# Patient Record
Sex: Male | Born: 1955 | Race: Black or African American | Hispanic: No | Marital: Married | Smoking: Never smoker
Health system: Southern US, Community
[De-identification: ages and names within clinical notes are randomized; demographics above are authoritative.]

## PROBLEM LIST (undated history)

## (undated) DIAGNOSIS — IMO0002 Reserved for concepts with insufficient information to code with codable children: Secondary | ICD-10-CM

## (undated) DIAGNOSIS — N9989 Other postprocedural complications and disorders of genitourinary system: Secondary | ICD-10-CM

## (undated) DIAGNOSIS — I10 Essential (primary) hypertension: Secondary | ICD-10-CM

## (undated) DIAGNOSIS — Z9889 Other specified postprocedural states: Secondary | ICD-10-CM

## (undated) DIAGNOSIS — E785 Hyperlipidemia, unspecified: Secondary | ICD-10-CM

## (undated) DIAGNOSIS — R112 Nausea with vomiting, unspecified: Secondary | ICD-10-CM

## (undated) HISTORY — DX: Essential (primary) hypertension: I10

## (undated) SURGERY — ARTHROSCOPY, KNEE, WITH LATERAL MENISCECTOMY
Anesthesia: Choice | Site: Knee | Laterality: Right

---

## 2006-03-10 HISTORY — PX: OTHER SURGICAL HISTORY: SHX169

## 2009-03-10 HISTORY — PX: OTHER SURGICAL HISTORY: SHX169

## 2011-05-06 ENCOUNTER — Ambulatory Visit (INDEPENDENT_AMBULATORY_CARE_PROVIDER_SITE_OTHER): Payer: BC Managed Care – PPO | Admitting: Orthopedic Surgery

## 2011-05-06 ENCOUNTER — Ambulatory Visit: Payer: Self-pay

## 2011-05-06 ENCOUNTER — Encounter: Payer: Self-pay | Admitting: Orthopedic Surgery

## 2011-05-06 VITALS — BP 130/60 | Ht 73.0 in | Wt 242.0 lb

## 2011-05-06 DIAGNOSIS — S43429A Sprain of unspecified rotator cuff capsule, initial encounter: Secondary | ICD-10-CM

## 2011-05-06 DIAGNOSIS — M25519 Pain in unspecified shoulder: Secondary | ICD-10-CM | POA: Insufficient documentation

## 2011-05-06 DIAGNOSIS — M75101 Unspecified rotator cuff tear or rupture of right shoulder, not specified as traumatic: Secondary | ICD-10-CM | POA: Insufficient documentation

## 2011-05-06 NOTE — Patient Instructions (Signed)
You have been scheduled for an MRI scan.  Your insurance company requires advocate precertification prior to scheduling the MRI.  If her MRI scan is not improved we will let you know and make further treatment recommendations according to your insurance's guidelines.   We will schedule you for a number of appointment to review the results and make further treatment recommendations

## 2011-05-06 NOTE — Progress Notes (Signed)
Patient ID: Scott Savage, male   DOB: 1955-08-05, 56 y.o.   MRN: [id number]  X-ray report  May 06, 2011  RIGHT shoulder pain gradual onset  The glenohumeral joint shows no joint space narrowing there is a small hint of an osteophyte at the inferior margin of the RIGHT humerus.  The shoulder Shenton's line has been broken and there is proximal migration of the humerus mild with a decreased acromial humeral distance.  He the acromion is a type II  Impression x-ray findings suggestive of rotator cuff disease if not tear. Subjective:    Scott Savage is a 56 y.o. male who presents with right shoulder pain for 2 months. The patient reports primarily constant pain which is worse at night. He cannot sleep on his right shoulder secondary to pain. 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 He has pain with forward elevation overhead activity with weakness. He had a left rotator cuff tear and repair several years ago and did well.  The pain is moderate to severe and described as dull aching over the right shoulder in the anterolateral area. Certain positions cause him discomfort and he cannot internally rotate his arm behind his back.  His pain is described as 7/10 constant worse at night throbbing associated with catching.   The following portions of the patient's history were reviewed and updated as appropriate: allergies, current medications, past family history, past medical history, past social history, past surgical history and problem list.  Review of Systems A comprehensive review of systems was negative except for: joint pain    Objective:    BP 130/60  Ht 6\' 1"  (1.854 m)  Wt 109.77 kg (242 lb)  BMI 31.93 kg/m2 Right shoulder: tenderness over the glenohumeral joint, distal neuromuscular exam negative, positive for tenderness about the glenohumeral joint, negative for tenderness over the acromioclavicular joint, positive for impingement sign,  sensory exam normal, motor exam normal, radial pulse intact and There is weakness in forward elevation as well as external rotation. Belly press test was normal. Shoulder was stable.  Left shoulder: normal active ROM, no tenderness, no impingement sign     Assessment:    Right Rotator cuff tear    Plan:    MRI.

## 2011-05-12 ENCOUNTER — Telehealth: Payer: Self-pay | Admitting: Orthopedic Surgery

## 2011-05-12 NOTE — Telephone Encounter (Signed)
Patient called in response to message left today - he received his MRI appointment, scheduled for 05/15/11 at [facility], 3:45pm registration.  He is to follow up here for results, appointment has been scheduled for 05/22/11.

## 2011-05-15 ENCOUNTER — Ambulatory Visit (HOSPITAL_COMMUNITY)
Admission: RE | Admit: 2011-05-15 | Discharge: 2011-05-15 | Disposition: A | Discharge status: Home / Self Care | Payer: BC Managed Care – PPO | Source: Ambulatory Visit | Attending: Orthopedic Surgery | Admitting: Orthopedic Surgery

## 2011-05-15 DIAGNOSIS — M75101 Unspecified rotator cuff tear or rupture of right shoulder, not specified as traumatic: Secondary | ICD-10-CM

## 2011-05-15 DIAGNOSIS — S46819A Strain of other muscles, fascia and tendons at shoulder and upper arm level, unspecified arm, initial encounter: Secondary | ICD-10-CM | POA: Insufficient documentation

## 2011-05-15 DIAGNOSIS — M25519 Pain in unspecified shoulder: Secondary | ICD-10-CM | POA: Insufficient documentation

## 2011-05-15 DIAGNOSIS — M25619 Stiffness of unspecified shoulder, not elsewhere classified: Secondary | ICD-10-CM | POA: Insufficient documentation

## 2011-05-15 DIAGNOSIS — X58XXXA Exposure to other specified factors, initial encounter: Secondary | ICD-10-CM | POA: Insufficient documentation

## 2011-05-22 ENCOUNTER — Encounter: Payer: Self-pay | Admitting: Orthopedic Surgery

## 2011-05-22 ENCOUNTER — Ambulatory Visit (INDEPENDENT_AMBULATORY_CARE_PROVIDER_SITE_OTHER): Payer: BC Managed Care – PPO | Admitting: Orthopedic Surgery

## 2011-05-22 VITALS — BP 120/70 | Ht 73.0 in | Wt 242.0 lb

## 2011-05-22 DIAGNOSIS — M75101 Unspecified rotator cuff tear or rupture of right shoulder, not specified as traumatic: Secondary | ICD-10-CM

## 2011-05-22 DIAGNOSIS — S43429A Sprain of unspecified rotator cuff capsule, initial encounter: Secondary | ICD-10-CM

## 2011-05-22 NOTE — Patient Instructions (Signed)
Rotator cuff tear   Need OOW 6 weeks   Then light duty

## 2011-05-22 NOTE — Progress Notes (Signed)
Patient ID: Scott Savage, male   DOB: 11/16/1955, 56 y.o.   MRN: [id number] Chief Complaint  Patient presents with  . Results    review MRI results, Rt rotator cuff   MRI RIGHT shoulder IMPRESSION:  1. Large full-thickness retracted tears of the infraspinatus is  supraspinatus tendons with atrophy of those muscles.  2. At least partial tear of the long head of the biceps tendon.  3. Focal degenerative tear of the posterior-superior aspect of the  labrum.  4. Prominent spurs at the [facility] joint which could predispose to  Impingement.  We discussed the surgical procedure required, which would need light duty for 6 weeks and in 6 months of rehabilitation before full duty would be allowed.  We need to do an open rotator cuff repair with a distal clavicle excision.

## 2011-05-30 ENCOUNTER — Telehealth: Payer: Self-pay | Admitting: Orthopedic Surgery

## 2011-05-30 NOTE — Telephone Encounter (Signed)
Patient called to request scheduling of surgery per last office visit.  He would like to have it on Friday, 07/04/11, which he said will work out with his job.   Does he need a pre-op office visit appointment scheduled?  His ph# is [phone].

## 2011-06-02 NOTE — Telephone Encounter (Signed)
Any special needs for this: open rotator cuff repair with a distal clavicle excision?

## 2011-06-03 NOTE — Telephone Encounter (Signed)
YES   ALL ROTATOR CUFF REPAIRS HAVE SAME SPECIAL NEEDS:  [employer] REP CALL REGULAR BED  SHOULDER IMMOBILIZER  AND THE REST ARE ON THE PAPER ORDER SHEET

## 2011-06-03 NOTE — Telephone Encounter (Signed)
Patient called back.  I saw that the question regarding special needs had been addressed.  I scheduled the patient to return for a visit to do the pre-op/schedule surgery, for 06/25/11.  He would like surgery date of 07/04/11.  If he does not need to come back to office for this 06/25/11 visit, please advise.

## 2011-06-04 NOTE — Telephone Encounter (Signed)
Please read chart   I will tell you when they need to come back ref surgery  most of them dont because there is no paperwork to sign   Its also in the chart

## 2011-06-04 NOTE — Telephone Encounter (Signed)
Let me know when u schedule this

## 2011-06-05 ENCOUNTER — Other Ambulatory Visit: Payer: Self-pay | Admitting: *Deleted

## 2011-06-05 NOTE — Telephone Encounter (Signed)
I think we've done this but if we haven't please schedule surgery for this patient

## 2011-06-05 NOTE — Telephone Encounter (Addendum)
Nurse contacted patient with surgery date of 07/04/11 and pre-op appointment of 06/24/11, 10:15am.  Called back to patient, voice message not available to relay he does not need to come for the 06/25/11 appointment.

## 2011-06-05 NOTE — Telephone Encounter (Signed)
Routed back to nurse re: scheduling surgery

## 2011-06-17 ENCOUNTER — Encounter: Payer: Self-pay | Admitting: Orthopedic Surgery

## 2011-06-19 ENCOUNTER — Encounter (HOSPITAL_COMMUNITY): Payer: Self-pay | Admitting: Pharmacy Technician

## 2011-06-24 ENCOUNTER — Encounter (HOSPITAL_COMMUNITY): Payer: Self-pay

## 2011-06-24 ENCOUNTER — Encounter (HOSPITAL_COMMUNITY)
Admission: RE | Admit: 2011-06-24 | Discharge: 2011-06-24 | Disposition: A | Discharge status: Home / Self Care | Payer: BC Managed Care – PPO | Source: Ambulatory Visit | Attending: Orthopedic Surgery | Admitting: Orthopedic Surgery

## 2011-06-24 HISTORY — DX: Reserved for concepts with insufficient information to code with codable children: IMO0002

## 2011-06-24 HISTORY — DX: Other specified postprocedural states: Z98.890

## 2011-06-24 HISTORY — DX: Hyperlipidemia, unspecified: E78.5

## 2011-06-24 HISTORY — DX: Other specified postprocedural states: R11.2

## 2011-06-24 HISTORY — DX: Other postprocedural complications and disorders of genitourinary system: N99.89

## 2011-06-24 LAB — HEMOGLOBIN AND HEMATOCRIT, BLOOD
HCT: 36.4 % — ABNORMAL LOW (ref 39.0–52.0)
Hemoglobin: 12.5 g/dL — ABNORMAL LOW (ref 13.0–17.0)

## 2011-06-24 LAB — BASIC METABOLIC PANEL
Chloride: 105 mEq/L (ref 96–112)
GFR calc Af Amer: 43 mL/min — ABNORMAL LOW (ref >90)
GFR calc non Af Amer: 37 mL/min — ABNORMAL LOW (ref >90)
Potassium: 4.2 mEq/L (ref 3.5–5.1)
Sodium: 138 mEq/L (ref 135–145)

## 2011-06-24 NOTE — Patient Instructions (Signed)
[place]  06/24/2011   Your procedure is scheduled on:  0615  Report to [facility] at [place] AM.  Call this number if you have problems the morning of surgery: [phone]   Remember:   Do not eat food:After Midnight.  May have clear liquids:until Midnight .  Clear liquids include soda, tea, black coffee, apple or grape juice, broth.  Take these medicines the morning of surgery with A SIP OF WATER: Enalapril   Do not wear jewelry, make-up or nail polish.  Do not wear lotions, powders, or perfumes. You may wear deodorant.  Do not shave 48 hours prior to surgery.  Do not bring valuables to the hospital.  Contacts, dentures or bridgework may not be worn into surgery.  Leave suitcase in the car. After surgery it may be brought to your room.  For patients admitted to the hospital, checkout time is 11:00 AM the day of discharge.   Patients discharged the day of surgery will not be allowed to drive home.  Name and phone number of your driver: Family  Special Instructions: CHG Shower Use Special Wash: 1/2 bottle night before surgery and 1/2 bottle morning of surgery.   Please read over the following fact sheets that you were given: Pain Booklet, Coughing and Deep Breathing, Surgical Site Infection Prevention, Anesthesia Post-op Instructions and Care and Recovery After Surgery  PATIENT INSTRUCTIONS POST-ANESTHESIA  IMMEDIATELY FOLLOWING SURGERY:  Do not drive or operate machinery for the first twenty four hours after surgery.  Do not make any important decisions for twenty four hours after surgery or while taking narcotic pain medications or sedatives.  If you develop intractable nausea and vomiting or a severe headache please notify your doctor immediately.  FOLLOW-UP:  Please make an appointment with your surgeon as instructed. You do not need to follow up with anesthesia unless specifically instructed to do so.  WOUND CARE INSTRUCTIONS (if applicable):  Keep a dry clean dressing on  the anesthesia/puncture wound site if there is drainage.  Once the wound has quit draining you may leave it open to air.  Generally you should leave the bandage intact for twenty four hours unless there is drainage.  If the epidural site drains for more than 36-48 hours please call the anesthesia department.  QUESTIONS?:  Please feel free to call your physician or the hospital operator if you have any questions, and they will be happy to assist you.     Rotator Cuff Injury The rotator cuff is the collective set of muscles and tendons that make up the stabilizing unit of your shoulder. This unit holds in the ball of the humerus (upper arm bone) in the socket of the scapula (shoulder blade). Injuries to this stabilizing unit most commonly come from sports or activities that cause the arm to be moved repeatedly over the head. Examples of this include throwing, weight lifting, swimming, racquet sports, or an injury such as falling on your arm. Chronic (longstanding) irritation of this unit can cause inflammation (soreness), bursitis, and eventual damage to the tendons to the point of rupture (tear). An acute (sudden) injury of the rotator cuff can result in a partial or complete tear. You may need surgery with complete tears. Small or partial rotator cuff tears may be treated conservatively with temporary immobilization, exercises and rest. Physical therapy may be needed. HOME CARE INSTRUCTIONS   Apply ice to the injury for 15 to 20 minutes 3 to 4 times per day for the first 2  days. Put the ice in a plastic bag and place a towel between the bag of ice and your skin.   If you have a shoulder immobilizer (sling and straps), do not remove it for as long as directed by your caregiver or until you see a caregiver for a follow-up examination. If you need to remove it, move your arm as little as possible.   You may want to sleep on several pillows or in a recliner at night to lessen swelling and pain.   Only take  over-the-counter or prescription medicines for pain, discomfort, or fever as directed by your caregiver.   Do simple hand squeezing exercises with a soft rubber ball to decrease hand swelling.  SEEK MEDICAL CARE IF:   Pain in your shoulder increases or new pain or numbness develops in your arm, hand, or fingers.   Your hand or fingers are colder than your other hand.  SEEK IMMEDIATE MEDICAL CARE IF:   Your arm, hand, or fingers are numb or tingling.   Your arm, hand, or fingers are increasingly swollen and painful, or turn white or blue.  Document Released: 02/22/2000 Document Revised: 02/13/2011 Document Reviewed: 02/15/2008 [facility] Patient Information 2012 [place].

## 2011-06-25 ENCOUNTER — Ambulatory Visit: Payer: BC Managed Care – PPO | Admitting: Orthopedic Surgery

## 2011-06-27 ENCOUNTER — Telehealth: Payer: Self-pay | Admitting: Orthopedic Surgery

## 2011-06-27 ENCOUNTER — Encounter: Payer: Self-pay | Admitting: Orthopedic Surgery

## 2011-06-27 NOTE — Telephone Encounter (Signed)
Contact to insurer, [place], ph# [phone], pre-authorization,surgery, and routed to [facility] [phone], regarding out-patient surgery scheduled at [facility] on 07/04/11.  Per Cordelia Pen, no pre-authorization is required for out-patient procedures under patient's state PPO plan; her name and today's date for reference.

## 2011-07-03 NOTE — H&P (Signed)
07/03/2011  Subjective:   Scott Savage is a 56 y.o. male who presents with right shoulder pain for 2 months. The patient reports primarily constant pain which is worse at night. He cannot sleep on his right shoulder secondary to pain. 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 He has pain with forward elevation overhead activity with weakness. He had a left rotator cuff tear and repair several years ago and did well.  The pain is moderate to severe and described as dull aching over the right shoulder in the anterolateral area. Certain positions cause him discomfort and he cannot internally rotate his arm behind his back.  His pain is described as 7/10 constant worse at night throbbing associated with catching.  The following portions of the patient's history were reviewed and updated as appropriate: allergies, current medications, past family history, past medical history, past social history, past surgical history and problem list.  Review of Systems  A comprehensive review of systems was negative except for: joint pain   Past Medical History  Diagnosis Date  . HTN (hypertension)   . PONV (postoperative nausea and vomiting)   . Hyperlipemia   . Urinary complications     Uses flomax to help empty bladder   Past Surgical History  Procedure Date  . Left shoulder 2008    Short Stay in [place]  . Left foot 2011    [facility]   History   Social History  . Marital Status: Married    Spouse Name: N/A    Number of Children: N/A  . Years of Education: 12   Occupational History  . Designer, industrial/product    Social History Main Topics  . Smoking status: Never Smoker   . Smokeless tobacco: Not on file  . Alcohol Use: No  . Drug Use: No  . Sexually Active: Not on file   Other Topics Concern  . Not on file   Social History Narrative  . No narrative on file    Objective:   BP 130/60  Ht 6\' 1"  (1.854 m)  Wt 109.77 kg (242 lb)  BMI  31.93 kg/m2  Vital signs are stable as recorded  General appearance is normal  The patient is alert and oriented x3  The patient's mood and affect are normal  Gait assessment: normal  The cardiovascular exam reveals normal pulses and temperature without edema swelling.  The lymphatic system is negative for palpable lymph nodes  The sensory exam is normal.  There are no pathologic reflexes.  Balance is normal.   Skin normal   Lower extremity exam   Inspection and palpation revealed no tenderness or abnormality in alignment in the lower extremities. Range of motion is full.  Strength is grade 5.   all joints are stable.     Right shoulder:  tenderness over the glenohumeral joint, distal neuromuscular exam negative, positive for tenderness about the glenohumeral joint, negative for tenderness over the acromioclavicular joint, positive for impingement sign, sensory exam normal, motor exam normal, radial pulse intact and There is weakness in forward elevation as well as external rotation. Belly press test was normal. Shoulder was stable.   Left shoulder:  normal active ROM, no tenderness, no impingement sign    Assessment:   Right Rotator cuff tear  Plan:    MRI OF THE RIGHT SHOULDER WITHOUT CONTRAST  Technique: Multiplanar, multisequence MR imaging was performed. No  intravenous contrast was administered.  Comparison: None.  Findings: There are full-thickness retracted tears of the distal  infraspinatus and supraspinatus tendons. The defect is 3 x 4 cm in  size. Some of the posterior fibers of the infraspinatus are  intact. There is mild to moderate atrophy of the supraspinatus  muscle and marked atrophy of the infraspinatus muscle. Teres minor  and subscapularis tendons are intact. The biceps tendon is  properly located but severely degenerated and quite diminutive. I  suspect there is at least a partial tear of the proximal long head  of the biceps tendon.  The  humeral head is superiorly subluxed with respect to the  glenoid. There is a small degenerative tear of the posterior  superior aspect of the labrum. Spurs are seen on the inferior  aspect of the humeral head.  There are prominent spurs on the inferior aspect of the anterior  portion of the acromion at the [facility] joint as well as on the inferior  aspect of the clavicle which could predispose to impingement.  IMPRESSION:  1. Large full-thickness retracted tears of the infraspinatus is  supraspinatus tendons with atrophy of those muscles.  2. At least partial tear of the long head of the biceps tendon.  3. Focal degenerative tear of the posterior-superior aspect of the  labrum.  4. Prominent spurs at the [facility] joint which could predispose to  impingement.  Original Report Authenticated By: Larey Seat, M.D.            External Result Report     External Result Report   Open right rotator cuff repair with distal clavicle excision if needed

## 2011-07-04 ENCOUNTER — Encounter (HOSPITAL_COMMUNITY): Payer: Self-pay | Admitting: Anesthesiology

## 2011-07-04 ENCOUNTER — Ambulatory Visit (HOSPITAL_COMMUNITY): Payer: BC Managed Care – PPO | Admitting: Anesthesiology

## 2011-07-04 ENCOUNTER — Encounter (HOSPITAL_COMMUNITY)
Admission: RE | Disposition: A | Discharge status: Home / Self Care | Payer: Self-pay | Source: Ambulatory Visit | Attending: Orthopedic Surgery

## 2011-07-04 ENCOUNTER — Ambulatory Visit (HOSPITAL_COMMUNITY)
Admission: RE | Admit: 2011-07-04 | Discharge: 2011-07-04 | Disposition: A | Discharge status: Home / Self Care | Payer: BC Managed Care – PPO | Source: Ambulatory Visit | Attending: Orthopedic Surgery | Admitting: Orthopedic Surgery

## 2011-07-04 ENCOUNTER — Encounter (HOSPITAL_COMMUNITY): Payer: Self-pay | Admitting: *Deleted

## 2011-07-04 DIAGNOSIS — M75101 Unspecified rotator cuff tear or rupture of right shoulder, not specified as traumatic: Secondary | ICD-10-CM

## 2011-07-04 DIAGNOSIS — Z79899 Other long term (current) drug therapy: Secondary | ICD-10-CM | POA: Insufficient documentation

## 2011-07-04 DIAGNOSIS — Z01810 Encounter for preprocedural cardiovascular examination: Secondary | ICD-10-CM | POA: Insufficient documentation

## 2011-07-04 DIAGNOSIS — I10 Essential (primary) hypertension: Secondary | ICD-10-CM | POA: Insufficient documentation

## 2011-07-04 DIAGNOSIS — M67919 Unspecified disorder of synovium and tendon, unspecified shoulder: Secondary | ICD-10-CM | POA: Insufficient documentation

## 2011-07-04 DIAGNOSIS — X58XXXA Exposure to other specified factors, initial encounter: Secondary | ICD-10-CM | POA: Insufficient documentation

## 2011-07-04 DIAGNOSIS — S43499A Other sprain of unspecified shoulder joint, initial encounter: Secondary | ICD-10-CM | POA: Insufficient documentation

## 2011-07-04 DIAGNOSIS — S43429A Sprain of unspecified rotator cuff capsule, initial encounter: Secondary | ICD-10-CM

## 2011-07-04 DIAGNOSIS — M19019 Primary osteoarthritis, unspecified shoulder: Secondary | ICD-10-CM | POA: Insufficient documentation

## 2011-07-04 DIAGNOSIS — M719 Bursopathy, unspecified: Secondary | ICD-10-CM | POA: Insufficient documentation

## 2011-07-04 DIAGNOSIS — Z01812 Encounter for preprocedural laboratory examination: Secondary | ICD-10-CM | POA: Insufficient documentation

## 2011-07-04 DIAGNOSIS — Y929 Unspecified place or not applicable: Secondary | ICD-10-CM | POA: Insufficient documentation

## 2011-07-04 HISTORY — PX: SHOULDER OPEN ROTATOR CUFF REPAIR: SHX2407

## 2011-07-04 HISTORY — PX: RESECTION DISTAL CLAVICAL: SHX5053

## 2011-07-04 SURGERY — REPAIR, ROTATOR CUFF, OPEN
Anesthesia: General | Site: Shoulder | Laterality: Right | Wound class: Clean

## 2011-07-04 MED ORDER — PHENYLEPHRINE HCL 10 MG/ML IJ SOLN
INTRAMUSCULAR | Status: DC | PRN
  Administered 2011-07-04 (×8): 100 ug via INTRAVENOUS

## 2011-07-04 MED ORDER — PREGABALIN 50 MG PO CAPS
50.0000 mg | ORAL_CAPSULE | Freq: Three times a day (TID) | ORAL | Status: DC
  Administered 2011-07-04: 50 mg via ORAL

## 2011-07-04 MED ORDER — ONDANSETRON HCL 4 MG/2ML IJ SOLN
INTRAMUSCULAR | Status: AC
  Administered 2011-07-04: 4 mg via INTRAVENOUS
  Filled 2011-07-04: qty 2

## 2011-07-04 MED ORDER — MIDAZOLAM HCL 2 MG/2ML IJ SOLN
1.0000 mg | INTRAMUSCULAR | Status: DC | PRN
  Administered 2011-07-04: 2 mg via INTRAVENOUS

## 2011-07-04 MED ORDER — NEOSTIGMINE METHYLSULFATE 1 MG/ML IJ SOLN
INTRAMUSCULAR | Status: DC | PRN
  Administered 2011-07-04: 3 mg via INTRAVENOUS

## 2011-07-04 MED ORDER — PHENYLEPHRINE HCL 10 MG/ML IJ SOLN
10000.0000 ug | INTRAVENOUS | Status: DC | PRN
  Administered 2011-07-04: 50 ug/min via INTRAVENOUS

## 2011-07-04 MED ORDER — CEFAZOLIN SODIUM-DEXTROSE 2-3 GM-% IV SOLR: 2.0000 g | INTRAVENOUS | Status: DC

## 2011-07-04 MED ORDER — ROCURONIUM BROMIDE 50 MG/5ML IV SOLN
INTRAVENOUS | Status: AC
  Filled 2011-07-04: qty 1

## 2011-07-04 MED ORDER — EPHEDRINE SULFATE 50 MG/ML IJ SOLN
INTRAMUSCULAR | Status: DC | PRN
  Administered 2011-07-04 (×2): 10 mg via INTRAVENOUS
  Administered 2011-07-04: 5 mg via INTRAVENOUS

## 2011-07-04 MED ORDER — ONDANSETRON HCL 4 MG/2ML IJ SOLN
4.0000 mg | Freq: Once | INTRAMUSCULAR | Status: AC
  Administered 2011-07-04: 4 mg via INTRAVENOUS

## 2011-07-04 MED ORDER — OXYCODONE HCL 5 MG PO TABS
5.0000 mg | ORAL_TABLET | ORAL | Status: DC
  Administered 2011-07-04: 5 mg via ORAL

## 2011-07-04 MED ORDER — GLYCOPYRROLATE 0.2 MG/ML IJ SOLN
INTRAMUSCULAR | Status: DC | PRN
  Administered 2011-07-04: .5 mg via INTRAVENOUS

## 2011-07-04 MED ORDER — DEXAMETHASONE SODIUM PHOSPHATE 4 MG/ML IJ SOLN
INTRAMUSCULAR | Status: AC
  Administered 2011-07-04: 4 mg via INTRAVENOUS
  Filled 2011-07-04: qty 1

## 2011-07-04 MED ORDER — OXYCODONE-ACETAMINOPHEN 5-325 MG PO TABS: 1.0000 | ORAL_TABLET | ORAL | Status: AC | PRN

## 2011-07-04 MED ORDER — LIDOCAINE HCL 1 % IJ SOLN
INTRAMUSCULAR | Status: DC | PRN
  Administered 2011-07-04: 40 mg via INTRADERMAL

## 2011-07-04 MED ORDER — FENTANYL CITRATE 0.05 MG/ML IJ SOLN
INTRAMUSCULAR | Status: DC | PRN
  Administered 2011-07-04 (×5): 50 ug via INTRAVENOUS

## 2011-07-04 MED ORDER — PROMETHAZINE HCL 12.5 MG PO TABS: 12.5000 mg | ORAL_TABLET | Freq: Four times a day (QID) | ORAL | Status: DC | PRN

## 2011-07-04 MED ORDER — FENTANYL CITRATE 0.05 MG/ML IJ SOLN
25.0000 ug | INTRAMUSCULAR | Status: DC | PRN
  Administered 2011-07-04 (×2): 25 ug via INTRAVENOUS

## 2011-07-04 MED ORDER — ROCURONIUM BROMIDE 100 MG/10ML IV SOLN
INTRAVENOUS | Status: DC | PRN
  Administered 2011-07-04: 10 mg via INTRAVENOUS
  Administered 2011-07-04: 40 mg via INTRAVENOUS
  Administered 2011-07-04: 10 mg via INTRAVENOUS

## 2011-07-04 MED ORDER — FENTANYL CITRATE 0.05 MG/ML IJ SOLN
INTRAMUSCULAR | Status: AC
  Administered 2011-07-04: 25 ug via INTRAVENOUS
  Filled 2011-07-04: qty 2

## 2011-07-04 MED ORDER — LACTATED RINGERS IV SOLN
INTRAVENOUS | Status: DC
  Administered 2011-07-04: 1000 mL via INTRAVENOUS
  Administered 2011-07-04: 09:00:00 via INTRAVENOUS

## 2011-07-04 MED ORDER — SCOPOLAMINE 1 MG/3DAYS TD PT72
1.0000 | MEDICATED_PATCH | Freq: Once | TRANSDERMAL | Status: DC
  Administered 2011-07-04: 1.5 mg via TRANSDERMAL

## 2011-07-04 MED ORDER — BUPIVACAINE-EPINEPHRINE PF 0.5-1:200000 % IJ SOLN
INTRAMUSCULAR | Status: DC | PRN
  Administered 2011-07-04 (×2): 30 mL

## 2011-07-04 MED ORDER — FENTANYL CITRATE 0.05 MG/ML IJ SOLN
INTRAMUSCULAR | Status: AC
  Administered 2011-07-04: 25 ug via INTRAVENOUS
  Filled 2011-07-04: qty 5

## 2011-07-04 MED ORDER — BUPIVACAINE-EPINEPHRINE PF 0.5-1:200000 % IJ SOLN
INTRAMUSCULAR | Status: AC
  Filled 2011-07-04: qty 20

## 2011-07-04 MED ORDER — ACETAMINOPHEN 10 MG/ML IV SOLN
1000.0000 mg | Freq: Once | INTRAVENOUS | Status: AC
  Administered 2011-07-04: 1000 mg via INTRAVENOUS

## 2011-07-04 MED ORDER — LIDOCAINE HCL (PF) 1 % IJ SOLN
INTRAMUSCULAR | Status: AC
  Filled 2011-07-04: qty 5

## 2011-07-04 MED ORDER — CEFAZOLIN SODIUM 1-5 GM-% IV SOLN
INTRAVENOUS | Status: DC | PRN
  Administered 2011-07-04: 2 g via INTRAVENOUS

## 2011-07-04 MED ORDER — FENTANYL CITRATE 0.05 MG/ML IJ SOLN
INTRAMUSCULAR | Status: AC
  Filled 2011-07-04: qty 2

## 2011-07-04 MED ORDER — EPHEDRINE SULFATE 50 MG/ML IJ SOLN
INTRAMUSCULAR | Status: AC
  Filled 2011-07-04: qty 1

## 2011-07-04 MED ORDER — CHLORHEXIDINE GLUCONATE 4 % EX LIQD
60.0000 mL | Freq: Once | CUTANEOUS | Status: DC
  Filled 2011-07-04: qty 60

## 2011-07-04 MED ORDER — ACETAMINOPHEN 10 MG/ML IV SOLN
INTRAVENOUS | Status: AC
  Administered 2011-07-04: 1000 mg via INTRAVENOUS
  Filled 2011-07-04: qty 100

## 2011-07-04 MED ORDER — ACETAMINOPHEN 325 MG PO TABS: 325.0000 mg | ORAL_TABLET | ORAL | Status: DC | PRN

## 2011-07-04 MED ORDER — PROPOFOL 10 MG/ML IV BOLUS
INTRAVENOUS | Status: DC | PRN
  Administered 2011-07-04: 200 mg via INTRAVENOUS

## 2011-07-04 MED ORDER — MIDAZOLAM HCL 2 MG/2ML IJ SOLN
INTRAMUSCULAR | Status: AC
  Administered 2011-07-04: 2 mg via INTRAVENOUS
  Filled 2011-07-04: qty 2

## 2011-07-04 MED ORDER — CEFAZOLIN SODIUM-DEXTROSE 2-3 GM-% IV SOLR
INTRAVENOUS | Status: AC
  Filled 2011-07-04: qty 50

## 2011-07-04 MED ORDER — SODIUM CHLORIDE 0.9 % IR SOLN
Status: DC | PRN
  Administered 2011-07-04: 1000 mL

## 2011-07-04 MED ORDER — CELECOXIB 100 MG PO CAPS
200.0000 mg | ORAL_CAPSULE | Freq: Every day | ORAL | Status: DC
  Administered 2011-07-04: 200 mg via ORAL

## 2011-07-04 MED ORDER — OXYCODONE HCL 5 MG PO TABS
ORAL_TABLET | ORAL | Status: AC
  Administered 2011-07-04: 5 mg via ORAL
  Filled 2011-07-04: qty 1

## 2011-07-04 MED ORDER — GLYCOPYRROLATE 0.2 MG/ML IJ SOLN
0.2000 mg | Freq: Once | INTRAMUSCULAR | Status: AC
  Administered 2011-07-04: 0.2 mg via INTRAVENOUS

## 2011-07-04 MED ORDER — GLYCOPYRROLATE 0.2 MG/ML IJ SOLN
INTRAMUSCULAR | Status: AC
  Administered 2011-07-04: 0.2 mg via INTRAVENOUS
  Filled 2011-07-04: qty 1

## 2011-07-04 MED ORDER — METHOCARBAMOL 100 MG/ML IJ SOLN
500.0000 mg | Freq: Once | INTRAVENOUS | Status: AC
  Administered 2011-07-04: 500 mg via INTRAVENOUS
  Filled 2011-07-04: qty 5

## 2011-07-04 MED ORDER — PHENYLEPHRINE HCL 10 MG/ML IJ SOLN
INTRAMUSCULAR | Status: AC
  Filled 2011-07-04: qty 1

## 2011-07-04 MED ORDER — PROPOFOL 10 MG/ML IV EMUL
INTRAVENOUS | Status: AC
  Filled 2011-07-04: qty 20

## 2011-07-04 MED ORDER — METHOCARBAMOL 500 MG PO TABS: 500.0000 mg | ORAL_TABLET | Freq: Four times a day (QID) | ORAL | Status: AC

## 2011-07-04 MED ORDER — SCOPOLAMINE 1 MG/3DAYS TD PT72
MEDICATED_PATCH | TRANSDERMAL | Status: AC
  Administered 2011-07-04: 1.5 mg via TRANSDERMAL
  Filled 2011-07-04: qty 1

## 2011-07-04 MED ORDER — DEXAMETHASONE SODIUM PHOSPHATE 4 MG/ML IJ SOLN
4.0000 mg | INTRAMUSCULAR | Status: AC
  Administered 2011-07-04: 4 mg via INTRAVENOUS

## 2011-07-04 MED ORDER — ONDANSETRON HCL 4 MG/2ML IJ SOLN: 4.0000 mg | Freq: Once | INTRAMUSCULAR | Status: DC | PRN

## 2011-07-04 MED ORDER — PREGABALIN 50 MG PO CAPS
ORAL_CAPSULE | ORAL | Status: AC
  Administered 2011-07-04: 50 mg via ORAL
  Filled 2011-07-04: qty 1

## 2011-07-04 MED ORDER — CELECOXIB 100 MG PO CAPS
ORAL_CAPSULE | ORAL | Status: AC
  Administered 2011-07-04: 200 mg via ORAL
  Filled 2011-07-04: qty 2

## 2011-07-04 SURGICAL SUPPLY — 91 items
ANCHOR PEEK 5.5MM (Anchor) ×2 IMPLANT
ANCHOR SUT SPARTAN (Anchor) ×2 IMPLANT
BAG HAMPER (MISCELLANEOUS) ×2 IMPLANT
BIT DRILL 2.0MX128MM (BIT) IMPLANT
BIT DRILL 2.0X128 (BIT) ×2 IMPLANT
BLADE HEX COATED 2.75 (ELECTRODE) ×2 IMPLANT
BLADE OSC/SAGITTAL MD 9X18.5 (BLADE) ×2 IMPLANT
BLADE SURG 15 STRL LF DISP TIS (BLADE) ×1 IMPLANT
BLADE SURG 15 STRL SS (BLADE) ×1
BLADE SURG SZ10 CARB STEEL (BLADE) ×4 IMPLANT
BNDG COHESIVE 4X5 TAN NS LF (GAUZE/BANDAGES/DRESSINGS) IMPLANT
BUR FAST CUTTING (BURR) ×1
BUR ROUND 5.0 (BURR) IMPLANT
BUR SRG 54X4.7X12 FLUT (BURR) ×1 IMPLANT
BURR SRG 54X4.7X12 FLUT (BURR) ×1
CATH KIT ON Q 2.5IN SLV (PAIN MANAGEMENT) IMPLANT
CHLORAPREP W/TINT 26ML (MISCELLANEOUS) ×2 IMPLANT
CLOTH BEACON ORANGE TIMEOUT ST (SAFETY) ×2 IMPLANT
CONNECTOR PERFECT PASSER (CONNECTOR) ×2 IMPLANT
COOLER CRYO CUFF IC AND MOTOR (MISCELLANEOUS) IMPLANT
COVER MAYO STAND XLG (DRAPE) ×4 IMPLANT
COVER PROBE W GEL 5X96 (DRAPES) ×2 IMPLANT
COVER SURGICAL LIGHT HANDLE (MISCELLANEOUS) ×4 IMPLANT
CUFF CRYO UNI SHDR 32X48 (MISCELLANEOUS) IMPLANT
DECANTER SPIKE VIAL GLASS SM (MISCELLANEOUS) ×4 IMPLANT
DRAPE ORTHO 2.5IN SPLIT 77X108 (DRAPES) ×2 IMPLANT
DRAPE ORTHO SPLIT 77X108 STRL (DRAPES) ×2
DRAPE PROXIMA HALF (DRAPES) ×2 IMPLANT
DRAPE U-SHAPE 47X51 STRL (DRAPES) IMPLANT
DRESSING ALLEVYN BORDER 5X5 (GAUZE/BANDAGES/DRESSINGS) IMPLANT
DRESSING ALLEVYN BORDER HEEL (GAUZE/BANDAGES/DRESSINGS) ×2 IMPLANT
ELECT REM PT RETURN 9FT ADLT (ELECTROSURGICAL) ×2
ELECTRODE REM PT RTRN 9FT ADLT (ELECTROSURGICAL) ×1 IMPLANT
FORMALIN 10 PREFIL 120ML (MISCELLANEOUS) ×2 IMPLANT
GLOVE BIOGEL PI IND STRL 7.0 (GLOVE) ×2 IMPLANT
GLOVE BIOGEL PI IND STRL 8 (GLOVE) ×1 IMPLANT
GLOVE BIOGEL PI INDICATOR 7.0 (GLOVE) ×2
GLOVE BIOGEL PI INDICATOR 8 (GLOVE) ×1
GLOVE ECLIPSE 6.5 STRL STRAW (GLOVE) ×4 IMPLANT
GLOVE ECLIPSE 7.0 STRL STRAW (GLOVE) ×2 IMPLANT
GLOVE SKINSENSE NS SZ8.0 LF (GLOVE) ×1
GLOVE SKINSENSE STRL SZ8.0 LF (GLOVE) ×1 IMPLANT
GLOVE SS BIOGEL STRL SZ 8 (GLOVE) ×2 IMPLANT
GLOVE SS N UNI LF 8.5 STRL (GLOVE) ×2 IMPLANT
GLOVE SUPERSENSE BIOGEL SZ 8 (GLOVE) ×2
GOWN STRL REIN XL XLG (GOWN DISPOSABLE) ×8 IMPLANT
IMMOBILIZER SHOULDER XLGE (ORTHOPEDIC SUPPLIES) ×2 IMPLANT
INST SET MINOR BONE (KITS) ×2 IMPLANT
KIT BLADEGUARD II DBL (SET/KITS/TRAYS/PACK) ×2 IMPLANT
KIT ROOM TURNOVER APOR (KITS) ×2 IMPLANT
KIT SURGICAL DEVON (SET/KITS/TRAYS/PACK) ×2 IMPLANT
MANIFOLD NEPTUNE II (INSTRUMENTS) ×2 IMPLANT
MARKER SKIN DUAL TIP RULER LAB (MISCELLANEOUS) ×2 IMPLANT
MATRIX HEMOSTAT SURGIFLO (HEMOSTASIS) ×4 IMPLANT
NEEDLE HYPO 21X1.5 SAFETY (NEEDLE) ×2 IMPLANT
NEEDLE MA TROC 1/2 (NEEDLE) IMPLANT
NEEDLE MAYO 6 CRC TAPER PT (NEEDLE) ×2 IMPLANT
NS IRRIG 1000ML POUR BTL (IV SOLUTION) ×2 IMPLANT
PACK BASIC III (CUSTOM PROCEDURE TRAY) ×1
PACK SRG BSC III STRL LF ECLPS (CUSTOM PROCEDURE TRAY) ×1 IMPLANT
PAD ARMBOARD 7.5X6 YLW CONV (MISCELLANEOUS) ×2 IMPLANT
PASSER SUT CAPTURE FIRST (SUTURE) IMPLANT
PENCIL HANDSWITCHING (ELECTRODE) ×2 IMPLANT
PUSHLOCK PEEK 4.5X24 (Orthopedic Implant) ×4 IMPLANT
RASP LG TEAR CROSS CUT (RASP) IMPLANT
RASP SM TEAR CROSS CUT (RASP) ×2 IMPLANT
SET BASIN LINEN APH (SET/KITS/TRAYS/PACK) ×2 IMPLANT
SLING ARM FOAM STRAP LRG (SOFTGOODS) IMPLANT
SLING ARM FOAM STRAP MED (SOFTGOODS) IMPLANT
SLING ARM FOAM STRAP XLG (SOFTGOODS) IMPLANT
SPONGE LAP 18X18 X RAY DECT (DISPOSABLE) ×2 IMPLANT
SPONGE LAP 4X18 X RAY DECT (DISPOSABLE) ×2 IMPLANT
STAPLER VISISTAT 35W (STAPLE) ×2 IMPLANT
STOCKINETTE IMPERVIOUS LG (DRAPES) ×2 IMPLANT
STRIP CLOSURE SKIN 1/2X4 (GAUZE/BANDAGES/DRESSINGS) IMPLANT
SUT BONE WAX W31G (SUTURE) IMPLANT
SUT BRALON NAB BRD #1 30IN (SUTURE) IMPLANT
SUT ETHIBOND 5 LR DA (SUTURE) IMPLANT
SUT ETHIBOND NAB OS 4 #2 30IN (SUTURE) ×2 IMPLANT
SUT ETHILON 3 0 FSL (SUTURE) IMPLANT
SUT MON AB 0 CT1 (SUTURE) ×2 IMPLANT
SUT MON AB 2-0 CT1 36 (SUTURE) ×2 IMPLANT
SUT PERFECTPASSER WHITE CART (SUTURE) ×2 IMPLANT
SUT PROLENE 3 0 PS 1 (SUTURE) IMPLANT
SUT SMART STITCH CARTRIDGE (SUTURE) ×4 IMPLANT
SUT VIC AB 1 CT1 27 (SUTURE)
SUT VIC AB 1 CT1 27XBRD ANTBC (SUTURE) IMPLANT
SYR 30ML LL (SYRINGE) ×2 IMPLANT
SYR BULB IRRIGATION 50ML (SYRINGE) ×4 IMPLANT
YANKAUER SUCT 12FT TUBE ARGYLE (SUCTIONS) ×4 IMPLANT
YANKAUER SUCT BULB TIP NO VENT (SUCTIONS) ×2 IMPLANT

## 2011-07-04 NOTE — Anesthesia Postprocedure Evaluation (Signed)
  Anesthesia Post-op Note  Patient: Scott Savage  Procedure(s) Performed: Procedure(s) (LRB): ROTATOR CUFF REPAIR SHOULDER OPEN (Right) RESECTION DISTAL CLAVICAL (Right)  Patient Location: PACU  Anesthesia Type: General  Level of Consciousness: sedated  Airway and Oxygen Therapy: Patient Spontanous Breathing and Patient connected to face mask oxygen  Post-op Pain: none  Post-op Assessment: Post-op Vital signs reviewed, Patient's Cardiovascular Status Stable, Respiratory Function Stable, Patent Airway and No signs of Nausea or vomiting  Post-op Vital Signs: Reviewed and stable  Complications: No apparent anesthesia complications

## 2011-07-04 NOTE — Transfer of Care (Addendum)
Immediate Anesthesia Transfer of Care Note  Patient: Scott Savage  Procedure(s) Performed: Procedure(s) (LRB): ROTATOR CUFF REPAIR SHOULDER OPEN (Right) RESECTION DISTAL CLAVICAL (Right)  Patient Location: PACU  Anesthesia Type: General  Level of Consciousness: sedated  Airway & Oxygen Therapy: Patient Spontanous Breathing and Patient connected to face mask oxygen  Post-op Assessment: Report given to PACU RN  Post vital signs: Reviewed and stable  Complications: No apparent anesthesia complications

## 2011-07-04 NOTE — Addendum Note (Signed)
Addendum  created 07/04/11 1021 by Ollen Bowl, CRNA   Modules edited:Charges VN

## 2011-07-04 NOTE — Op Note (Signed)
07/04/2011  10:05 AM  PATIENT:  Scott Savage  56 y.o. male  PRE-OPERATIVE DIAGNOSIS:  right rotator cuff tear, oa ac joint   POST-OPERATIVE DIAGNOSIS:  right rotator cuff tear, oa ac joint, biceps tear   PROCEDURE:  Procedure(s) (LRB): ROTATOR CUFF REPAIR SHOULDER OPEN (Right) RESECTION DISTAL CLAVICAL (Right) BICEPS TENOTOMY   Findings complete tear the supraspinatus tendon and a portion of the infraspinatus tendon. The retraction was to the level of the glenoid. The biceps tendon was completely point.  Details of procedure: The patient was identified in the preop area the surgical site was confirmed and marked as right shoulder. The chart was reviewed and updated and the consent form was signed.  The patient was taken to the operating room and given intravenous Ancef. This was followed by intubation and general anesthesia.  The patient was placed in the semi-beachchair position and the right arm was prepped and draped sterilely  A timeout procedure was then completed  The incision was made over the anterolateral acromion and taken down 5 cm distally. The subcutaneous tissue was divided. The deltoid raphe was split between the anterior and middle deltoid and taken across the acromion and a.c. joint with full thickness subperiosteal dissection. A thickened subacromial subdeltoid bursa was excised exposing the rotator cuff. The cuff was retracted to the level of the glenoid. Multiple stay sutures were placed in the rotator cuff and the rotator cuff was reduced with gentle superior and inferior releases.  The distal clavicle was excised with an oscillating saw and contoured with a motorized rasp.  The distal acromion was sharp and spiked and was removed. The coracoclavicular ligament was preserved  The greater tuberosity was prepared with a bone and multiple drill holes  2 suture anchors were placed at the articular margin. A total of 4 sutures were passed medial to the edge of the  rotator cuff all 4 sutures were tied and the tails were preserved  Crisscross diamondback pattern was then used to repair the cuff at the lateral using to push lock anchors. The shoulder was then taken through range of motion. There was no tension on the repair full head coverage was performed and there was no tension with the arm at his side  The wound was irrigated the deltoid split was closed with #2 Ethibond suture. The subcutaneous tissue was closed with 0 Monocryl and 2-0 Monocryl. 60 cc of Marcaine was injected below the cuff into the wound edges for postoperative pain relief.  Skin staples were used to reapproximate the skin edges  A dressing was applied along with a shoulder immobilizer.  The patient was then taken to the recovery room after extubation in stable condition  SURGEON:  Surgeon(s) and Role:    * Carole Civil, MD - Primary  PHYSICIAN ASSISTANT:   ASSISTANTS: WAYNE MCFATTER    ANESTHESIA:   general  EBL:  Total I/O In: 1000 [I.V.:1000] Out: 16 [Blood:50]  BLOOD ADMINISTERED:none  DRAINS: none   LOCAL MEDICATIONS USED:  MARCAINE WITH EPI 60 CC      SPECIMEN:  Source of Specimen:  DISTAL CLAVICLE AND ACROMION   DISPOSITION OF SPECIMEN:  PATHOLOGY  COUNTS:  YES  TOURNIQUET:  * No tourniquets in log *  DICTATION: .Dragon Dictation  PLAN OF CARE: Discharge to home after PACU  PATIENT DISPOSITION:  PACU - hemodynamically stable.   Delay start of Pharmacological VTE agent (>24hrs) due to surgical blood loss or risk of bleeding: not applicable

## 2011-07-04 NOTE — Anesthesia Procedure Notes (Signed)
Procedure Name: Intubation Date/Time: 07/04/2011 7:45 AM Performed by: Tressie Stalker E Pre-anesthesia Checklist: Patient identified, Patient being monitored, Timeout performed, Emergency Drugs available and Suction available Patient Re-evaluated:Patient Re-evaluated prior to inductionOxygen Delivery Method: Circle System Utilized Preoxygenation: Pre-oxygenation with 100% oxygen Intubation Type: IV induction Ventilation: Mask ventilation without difficulty Laryngoscope Size: Mac and 3 Grade View: Grade I Tube type: Oral Tube size: 8.0 mm Number of attempts: 1 Airway Equipment and Method: stylet Placement Confirmation: ETT inserted through vocal cords under direct vision,  positive ETCO2 and breath sounds checked- equal and bilateral Secured at: 23 cm Tube secured with: Tape Dental Injury: Teeth and Oropharynx as per pre-operative assessment

## 2011-07-04 NOTE — Anesthesia Preprocedure Evaluation (Signed)
Anesthesia Evaluation  Patient identified by MRN, date of birth, ID band Patient awake    Reviewed: Allergy & Precautions, H&P , NPO status , Patient's Chart, lab work & pertinent test results  History of Anesthesia Complications (+) PONV  Airway Mallampati: I TM Distance: >3 FB Neck ROM: Full    Dental No notable dental hx.    Pulmonary neg pulmonary ROS,    Pulmonary exam normal       Cardiovascular hypertension, Pt. on medications Rhythm:Regular Rate:Normal     Neuro/Psych negative neurological ROS  negative psych ROS   GI/Hepatic negative GI ROS, Neg liver ROS,   Endo/Other  negative endocrine ROS  Renal/GU negative Renal ROS     Musculoskeletal negative musculoskeletal ROS (+)   Abdominal Normal abdominal exam  (+)   Peds  Hematology negative hematology ROS (+)   Anesthesia Other Findings   Reproductive/Obstetrics                           Anesthesia Physical Anesthesia Plan  ASA: II  Anesthesia Plan: General   Post-op Pain Management:    Induction: Intravenous  Airway Management Planned: Oral ETT  Additional Equipment:   Intra-op Plan:   Post-operative Plan: Extubation in OR  Informed Consent: I have reviewed the patients History and Physical, chart, labs and discussed the procedure including the risks, benefits and alternatives for the proposed anesthesia with the patient or authorized representative who has indicated his/her understanding and acceptance.     Plan Discussed with: CRNA  Anesthesia Plan Comments:         Anesthesia Quick Evaluation

## 2011-07-04 NOTE — Interval H&P Note (Signed)
History and Physical Interval Note:  07/04/2011 7:18 AM  Scott Savage  has presented today for surgery, with the diagnosis of right rotator cuff tear  The various methods of treatment have been discussed with the patient and family. After consideration of risks, benefits and other options for treatment, the patient has consented to  Procedure(s) (LRB): ROTATOR CUFF REPAIR SHOULDER OPEN (Right) RESECTION DISTAL CLAVICAL (Right) as a surgical intervention .  The patients' history has been reviewed, patient examined, no change in status, stable for surgery.  I have reviewed the patients' chart and labs.  Questions were answered to the patient's satisfaction.     Arther Abbott

## 2011-07-04 NOTE — Brief Op Note (Signed)
07/04/2011  10:05 AM  PATIENT:  Scott Savage  56 y.o. male  PRE-OPERATIVE DIAGNOSIS:  right rotator cuff tear, oa ac joint   POST-OPERATIVE DIAGNOSIS:  right rotator cuff tear, oa ac joint, biceps tear   PROCEDURE:  Procedure(s) (LRB): ROTATOR CUFF REPAIR SHOULDER OPEN (Right) RESECTION DISTAL CLAVICAL (Right) BICEPS TENOTOMY   SURGEON:  Surgeon(s) and Role:    * Carole Civil, MD - Primary  PHYSICIAN ASSISTANT:   ASSISTANTS: WAYNE MCFATTER    ANESTHESIA:   general  EBL:  Total I/O In: 1000 [I.V.:1000] Out: 50 [Blood:50]  BLOOD ADMINISTERED:none  DRAINS: none   LOCAL MEDICATIONS USED:  MARCAINE WITH EPI 60 CC      SPECIMEN:  Source of Specimen:  DISTAL CLAVICLE AND ACROMION   DISPOSITION OF SPECIMEN:  PATHOLOGY  COUNTS:  YES  TOURNIQUET:  * No tourniquets in log *  DICTATION: .Dragon Dictation  PLAN OF CARE: Discharge to home after PACU  PATIENT DISPOSITION:  PACU - hemodynamically stable.   Delay start of Pharmacological VTE agent (>24hrs) due to surgical blood loss or risk of bleeding: not applicable

## 2011-07-07 ENCOUNTER — Ambulatory Visit (INDEPENDENT_AMBULATORY_CARE_PROVIDER_SITE_OTHER): Payer: BC Managed Care – PPO | Admitting: Orthopedic Surgery

## 2011-07-07 ENCOUNTER — Encounter: Payer: Self-pay | Admitting: Orthopedic Surgery

## 2011-07-07 VITALS — BP 120/74 | Ht 73.0 in | Wt 240.0 lb

## 2011-07-07 DIAGNOSIS — Z9889 Other specified postprocedural states: Secondary | ICD-10-CM

## 2011-07-07 NOTE — Patient Instructions (Signed)
Sling all the time   3 x a day straighten arm

## 2011-07-07 NOTE — Progress Notes (Signed)
Patient ID: Scott Savage, male   DOB: October 18, 1955, 56 y.o.   MRN: [id number] Chief Complaint  Patient presents with  . Routine Post Op    post op 1, right rotator cuff repair, DOS 07/03/11    BP 120/74  Ht 6\' 1"  (1.854 m)  Wt 240 lb (108.863 kg)  BMI 31.66 kg/m2  Right rotator cuff repair with a transosseous equivalent repair using 2 medial anchors and 2 lateral push locks a total of 8 sutures for large rotator cuff repair involving the infraspinatus and supraspinatus tendons  His pain is well controlled his incision is clean dry and intact without excessive drainage  Is neurovascularly intact  He's placed in a sling shot abduction pillow  Return for staple removal in about a week  Therapy will start at 6 weeks postop  He is allowed to remove his arm from the sling to straighten the arm.

## 2011-07-17 ENCOUNTER — Ambulatory Visit (INDEPENDENT_AMBULATORY_CARE_PROVIDER_SITE_OTHER): Payer: BC Managed Care – PPO | Admitting: Orthopedic Surgery

## 2011-07-17 ENCOUNTER — Encounter: Payer: Self-pay | Admitting: Orthopedic Surgery

## 2011-07-17 VITALS — BP 90/60 | Ht 73.0 in | Wt 240.0 lb

## 2011-07-17 DIAGNOSIS — M75101 Unspecified rotator cuff tear or rupture of right shoulder, not specified as traumatic: Secondary | ICD-10-CM

## 2011-07-17 DIAGNOSIS — S43429A Sprain of unspecified rotator cuff capsule, initial encounter: Secondary | ICD-10-CM

## 2011-07-17 NOTE — Patient Instructions (Signed)
Continue the sling

## 2011-07-17 NOTE — Progress Notes (Signed)
Patient ID: Scott Savage, male   DOB: October 31, 1955, 56 y.o.   MRN: [id number] Chief Complaint  Patient presents with  . Follow-up    10 day recheck on right shoulder and staples out.    Status post rotator cuff repair right shoulder multifield tendon repair multiple anchor repair with transosseous equivalent open technique  Doing well minimal pain using sling as requested  Staples out since incision looks clean  Continue sling followup 4 weeks we'll start therapy at that time

## 2011-08-14 ENCOUNTER — Encounter: Payer: Self-pay | Admitting: Orthopedic Surgery

## 2011-08-14 ENCOUNTER — Ambulatory Visit (INDEPENDENT_AMBULATORY_CARE_PROVIDER_SITE_OTHER): Payer: BC Managed Care – PPO | Admitting: Orthopedic Surgery

## 2011-08-14 VITALS — BP 124/78 | Ht 73.0 in | Wt 240.0 lb

## 2011-08-14 DIAGNOSIS — Z9889 Other specified postprocedural states: Secondary | ICD-10-CM

## 2011-08-14 NOTE — Progress Notes (Signed)
Patient ID: Scott Savage, male   DOB: 03-05-1956, 56 y.o.   MRN: [id number] Chief Complaint  Patient presents with  . Follow-up    4 week follow up right shoulder, DOS 07/04/11    RIGHT rotator cuff repair, open, large tear.  Doing well 6 one pain pill at night. No swelling. Wound healed.  Recommend start physical therapy at [facility] followup 6 weeks.  Light, duty. We'll start June 19, for 6 weeks and then reassess at next office visit

## 2011-08-14 NOTE — Patient Instructions (Addendum)
WORK RETURN LIGHT DUTY NO LIFTING RIGHT ARM: START June 19TH FOR 6 WEEKS   WEAR SLING WHEN OUT OF THE HOUSE

## 2011-08-19 ENCOUNTER — Telehealth: Payer: Self-pay | Admitting: *Deleted

## 2011-08-19 ENCOUNTER — Telehealth: Payer: Self-pay | Admitting: Orthopedic Surgery

## 2011-08-19 NOTE — Telephone Encounter (Signed)
(  1)Per patient and disability insurance company request ([employer]) -Faxed updated medical office notes as per request to Fax [phone], authorization on file. (2) Per employer request ([employer]) - Faxed request for accomodation form, completed by Dr. Aline Brochure, with requested medical and operative notes, to # [phone], authorization on file. Patient aware that all requested medical information has been faxed.

## 2011-08-20 NOTE — Telephone Encounter (Signed)
Protocol faxed to [facility]

## 2011-09-25 ENCOUNTER — Encounter: Payer: Self-pay | Admitting: Orthopedic Surgery

## 2011-09-25 ENCOUNTER — Ambulatory Visit (INDEPENDENT_AMBULATORY_CARE_PROVIDER_SITE_OTHER): Payer: BC Managed Care – PPO | Admitting: Orthopedic Surgery

## 2011-09-25 VITALS — Ht 73.0 in | Wt 240.0 lb

## 2011-09-25 DIAGNOSIS — Z9889 Other specified postprocedural states: Secondary | ICD-10-CM

## 2011-09-25 NOTE — Patient Instructions (Addendum)
Continue therapy x 6 weeks   Continue work restrictions 6 weeks   Return in 6 weeks

## 2011-09-25 NOTE — Progress Notes (Signed)
Patient ID: Scott Savage, male   DOB: 05/31/1955, 56 y.o.   MRN: [id number] Chief Complaint  Patient presents with  . Follow-up    6 week recheck on right shoulder.    Rotator cuff repair of her April 26  This is the 3 month followup visit.  Doing well progressing in therapy, requiring no pain medication.  Followup 6 weeks, restrictions 6 more weeks at work, therapy, 6 more weeks

## 2011-09-29 ENCOUNTER — Encounter: Payer: Self-pay | Admitting: Orthopedic Surgery

## 2011-11-06 ENCOUNTER — Ambulatory Visit (INDEPENDENT_AMBULATORY_CARE_PROVIDER_SITE_OTHER): Payer: BC Managed Care – PPO | Admitting: Orthopedic Surgery

## 2011-11-06 ENCOUNTER — Encounter: Payer: Self-pay | Admitting: Orthopedic Surgery

## 2011-11-06 VITALS — BP 130/80 | Ht 73.0 in | Wt 235.0 lb

## 2011-11-06 DIAGNOSIS — Z8739 Personal history of other diseases of the musculoskeletal system and connective tissue: Secondary | ICD-10-CM

## 2011-11-06 NOTE — Progress Notes (Signed)
Patient ID: Scott Savage, male   DOB: 1956/02/09, 56 y.o.   MRN: [id number] Chief Complaint  Patient presents with  . Follow-up    recheck right rotator cuff, DOS 07/04/11   The patient is in the midst of rotator cuff rehabilitation after right rotator cuff repair open technique doing well progressing well in now for elevate the arm 120. Still having a lot of scapular substitution. Recommend continue work status as is for 55 days come back and see me in 6 weeks with plans for return to full duty at 6 months from surgery

## 2011-11-06 NOTE — Patient Instructions (Signed)
Continue work restrictions x 55 days

## 2011-12-18 ENCOUNTER — Encounter: Payer: Self-pay | Admitting: Orthopedic Surgery

## 2011-12-18 ENCOUNTER — Ambulatory Visit (INDEPENDENT_AMBULATORY_CARE_PROVIDER_SITE_OTHER): Payer: BC Managed Care – PPO | Admitting: Orthopedic Surgery

## 2011-12-18 VITALS — BP 100/62 | Ht 72.0 in | Wt 232.0 lb

## 2011-12-18 DIAGNOSIS — Z9889 Other specified postprocedural states: Secondary | ICD-10-CM

## 2011-12-18 NOTE — Patient Instructions (Signed)
[place] for training with pistol but not shotgun  Work status : Full unrestricted duty Nov 1

## 2011-12-18 NOTE — Progress Notes (Signed)
Patient ID: Scott Savage, male   DOB: 1955/09/10, 56 y.o.   MRN: [id number] Chief Complaint  Patient presents with  . Follow-up    follow up for right RCR, DOS 07/04/11    Almost 6 months status post right rotator cuff repair the patient is making excellent progress has full forward elevation of the shoulder with slight weakness of the rotator cuff  Recommend continue progressive increase in strengthening exercises and he can resume all duties around November 1  Come back in 2 months

## 2012-03-23 ENCOUNTER — Encounter: Payer: Self-pay | Admitting: Orthopedic Surgery

## 2012-03-23 ENCOUNTER — Ambulatory Visit: Payer: BC Managed Care – PPO | Admitting: Orthopedic Surgery

## 2016-02-20 DIAGNOSIS — I10 Essential (primary) hypertension: Secondary | ICD-10-CM | POA: Insufficient documentation

## 2016-02-27 DIAGNOSIS — E291 Testicular hypofunction: Secondary | ICD-10-CM | POA: Insufficient documentation

## 2016-09-08 DIAGNOSIS — N184 Chronic kidney disease, stage 4 (severe): Secondary | ICD-10-CM | POA: Insufficient documentation

## 2017-12-02 ENCOUNTER — Ambulatory Visit: Payer: BC Managed Care – PPO | Admitting: Orthopedic Surgery

## 2018-03-18 DIAGNOSIS — F4320 Adjustment disorder, unspecified: Secondary | ICD-10-CM | POA: Insufficient documentation

## 2018-09-02 DIAGNOSIS — I119 Hypertensive heart disease without heart failure: Secondary | ICD-10-CM | POA: Insufficient documentation

## 2018-09-06 ENCOUNTER — Ambulatory Visit (INDEPENDENT_AMBULATORY_CARE_PROVIDER_SITE_OTHER): Payer: Medicare Other

## 2018-09-06 ENCOUNTER — Encounter: Payer: Self-pay | Admitting: Orthopedic Surgery

## 2018-09-06 ENCOUNTER — Ambulatory Visit (INDEPENDENT_AMBULATORY_CARE_PROVIDER_SITE_OTHER): Payer: Medicare Other | Admitting: Orthopedic Surgery

## 2018-09-06 ENCOUNTER — Other Ambulatory Visit: Payer: Self-pay

## 2018-09-06 VITALS — BP 164/83 | HR 79 | Ht 72.0 in | Wt 236.0 lb

## 2018-09-06 DIAGNOSIS — M1712 Unilateral primary osteoarthritis, left knee: Secondary | ICD-10-CM | POA: Diagnosis not present

## 2018-09-06 DIAGNOSIS — G8929 Other chronic pain: Secondary | ICD-10-CM | POA: Diagnosis not present

## 2018-09-06 DIAGNOSIS — M25562 Pain in left knee: Secondary | ICD-10-CM

## 2018-09-06 DIAGNOSIS — M1710 Unilateral primary osteoarthritis, unspecified knee: Secondary | ICD-10-CM

## 2018-09-06 DIAGNOSIS — M1711 Unilateral primary osteoarthritis, right knee: Secondary | ICD-10-CM | POA: Insufficient documentation

## 2018-09-06 NOTE — Progress Notes (Signed)
Scott Savage  09/06/2018  HISTORY SECTION :  Chief Complaint  Patient presents with  . Knee Pain    LT knee pain on and off for 2 months   63 year old male with family history of parent being on dialysis and some difficulties with his kidney indicates he cannot take certain medications presents for evaluation of his left knee with intermittent pain for 2 months noted over the medial joint line with no prior treatment.  When present the pain is moderate to severe associated with load shifting to the right and some pain in his lower back.  No swelling or mechanical symptoms    Review of Systems  Constitutional: Negative for fever.  Respiratory: Negative for shortness of breath.   Cardiovascular: Negative for chest pain.  Musculoskeletal: Positive for back pain.  Skin: Negative.   Neurological: Negative for tingling and sensory change.     Past Medical History:  Diagnosis Date  . HTN (hypertension)   . Hyperlipemia   . PONV (postoperative nausea and vomiting)   . Urinary complications    Uses flomax to help empty bladder    Past Surgical History:  Procedure Laterality Date  . left foot  2011   [facility]  . left shoulder  2008   Short Stay in [place]  . RESECTION DISTAL CLAVICAL  07/04/2011   Procedure: RESECTION DISTAL CLAVICAL;  Surgeon: Carole Civil, MD;  Location: [facility];  Service: Orthopedics;  Laterality: Right;  . SHOULDER OPEN ROTATOR CUFF REPAIR  07/04/2011   Procedure: ROTATOR CUFF REPAIR SHOULDER OPEN;  Surgeon: Carole Civil, MD;  Location: [facility];  Service: Orthopedics;  Laterality: Right;     Allergies  Allergen Reactions  . Hydrocodone Itching and Nausea And Vomiting     Current Outpatient Medications:  .  escitalopram (LEXAPRO) 10 MG tablet, Take 10 mg by mouth daily. as directed, Disp: , Rfl:  .  losartan (COZAAR) 100 MG tablet, Take 100 mg by mouth daily. as directed, Disp: , Rfl:  .  promethazine (PHENERGAN) 12.5 MG  tablet, Take 12.5 mg by mouth every 6 (six) hours as needed., Disp: , Rfl:  .  simvastatin (ZOCOR) 40 MG tablet, Take 40 mg by mouth every evening., Disp: , Rfl:  .  Tamsulosin HCl (FLOMAX) 0.4 MG CAPS, Take 0.4 mg by mouth daily., Disp: , Rfl:  .  testosterone cypionate (DEPOTESTOSTERONE CYPIONATE) 200 MG/ML injection, INJECT 1 ML IM EVERY 4 WEEKS, Disp: , Rfl:  .  HYDROCODONE-ACETAMINOPHEN PO, Take by mouth., Disp: , Rfl:    PHYSICAL EXAM SECTION: 1) BP (!) 164/83   Pulse 79   Ht 6' (1.829 m)   Wt 236 lb (107 kg)   BMI 32.01 kg/m   Body mass index is 32.01 kg/m. General appearance: Well-developed well-nourished no gross deformities  2) Cardiovascular normal pulse and perfusion in all 4 extremities normal color without edema  3) Neurologically deep tendon reflexes are equal and normal, no sensation loss or deficits no pathologic reflexes  4) Psychological: Awake alert and oriented x3 mood and affect normal  5) Skin no lacerations or ulcerations no nodularity no palpable masses, no erythema or nodularity  6) Musculoskeletal:   Right Knee Exam   Muscle Strength  The patient has normal right knee strength.  Tenderness  The patient is experiencing tenderness in the medial joint line.  Range of Motion  Extension: normal  Flexion: normal   Tests  McMurray:  Medial - negative Lateral - negative  Varus: negative Valgus: negative Drawer:  Anterior - negative    Posterior - negative  Other  Erythema: absent Scars: absent Sensation: normal Pulse: present Swelling: none Effusion: no effusion present   Left Knee Exam   Muscle Strength  The patient has normal left knee strength.  Tenderness  The patient is experiencing no tenderness.   Range of Motion  Extension: normal  Flexion: normal   Tests  McMurray:  Medial - negative Lateral - negative Varus: negative Valgus: negative Drawer:  Anterior - negative     Posterior - negative  Other  Erythema: absent Scars:  absent Sensation: normal Pulse: present Swelling: none       MEDICAL DECISION SECTION:  Encounter Diagnoses  Name Primary?  . Chronic pain of left knee   . Primary localized osteoarthritis of knee-LEFT  Yes    [facility] [place] imaging  AP lateral and sunrise view of the left knee for left knee pain  Alignment normal  Medial compartment joint space narrowing mild  Lateral compartment normal  Patellofemoral compartment severe joint space narrowing  There are secondary bone changes throughout the knee  Impression osteoarthritis patellofemoral compartment severe medial compartment moderate  Plan:  (Rx., Inj., surg., Frx, MRI/CT, XR:2)  Procedure note left knee injection   verbal consent was obtained to inject left knee joint  Timeout was completed to confirm the site of injection  The medications used were 40 mg of Depo-Medrol and 1% lidocaine 3 cc  Anesthesia was provided by ethyl chloride and the skin was prepped with alcohol.  After cleaning the skin with alcohol a 20-gauge needle was used to inject the left knee joint. There were no complications. A sterile bandage was applied.  TRY OTC VOLTAREN 4 X A DAY   FU AS NEEDED   10:31 AM Arther Abbott, MD 09/06/2018

## 2018-09-06 NOTE — Patient Instructions (Addendum)
TRY OVER THE COUNTER VOLTAREN APPY 4 GM 4 X A DAY AS NEEDED   You have received an injection of steroids into the joint. 15% of patients will have increased pain within the 24 hours postinjection.   This is transient and will go away.   We recommend that you use ice packs on the injection site for 20 minutes every 2 hours and extra strength Tylenol 2 tablets every 8 as needed until the pain resolves.  If you continue to have pain after taking the Tylenol and using the ice please call the office for further instructions.   Osteoarthritis  Osteoarthritis is a type of arthritis that affects tissue that covers the ends of bones in joints (cartilage). Cartilage acts as a cushion between the bones and helps them move smoothly. Osteoarthritis results when cartilage in the joints gets worn down. Osteoarthritis is sometimes called "wear and tear" arthritis. Osteoarthritis is the most common form of arthritis. It often occurs in older people. It is a condition that gets worse over time (a progressive condition). Joints that are most often affected by this condition are in:  Fingers.  Toes.  Hips.  Knees.  Spine, including neck and lower back. What are the causes? This condition is caused by age-related wearing down of cartilage that covers the ends of bones. What increases the risk? The following factors may make you more likely to develop this condition:  Older age.  Being overweight or obese.  Overuse of joints, such as in athletes.  Past injury of a joint.  Past surgery on a joint.  Family history of osteoarthritis. What are the signs or symptoms? The main symptoms of this condition are pain, swelling, and stiffness in the joint. The joint may lose its shape over time. Small pieces of bone or cartilage may break off and float inside of the joint, which may cause more pain and damage to the joint. Small deposits of bone (osteophytes) may grow on the edges of the joint. Other  symptoms may include:  A grating or scraping feeling inside the joint when you move it.  Popping or creaking sounds when you move. Symptoms may affect one or more joints. Osteoarthritis in a major joint, such as your knee or hip, can make it painful to walk or exercise. If you have osteoarthritis in your hands, you might not be able to grip items, twist your hand, or control small movements of your hands and fingers (fine motor skills). How is this diagnosed? This condition may be diagnosed based on:  Your medical history.  A physical exam.  Your symptoms.  X-rays of the affected joint(s).  Blood tests to rule out other types of arthritis. How is this treated? There is no cure for this condition, but treatment can help to control pain and improve joint function. Treatment plans may include:  A prescribed exercise program that allows for rest and joint relief. You may work with a physical therapist.  A weight control plan.  Pain relief techniques, such as: ? Applying heat and cold to the joint. ? Electric pulses delivered to nerve endings under the skin (transcutaneous electrical nerve stimulation, or TENS). ? Massage. ? Certain nutritional supplements.  NSAIDs or prescription medicines to help relieve pain.  Medicine to help relieve pain and inflammation (corticosteroids). This can be given by mouth (orally) or as an injection.  Assistive devices, such as a brace, wrap, splint, specialized glove, or cane.  Surgery, such as: ? An osteotomy. This is done  to reposition the bones and relieve pain or to remove loose pieces of bone and cartilage. ? Joint replacement surgery. You may need this surgery if you have very bad (advanced) osteoarthritis. Follow these instructions at home: Activity  Rest your affected joints as directed by your health care provider.  Do not drive or use heavy machinery while taking prescription pain medicine.  Exercise as directed. Your health care  provider or physical therapist may recommend specific types of exercise, such as: ? Strengthening exercises. These are done to strengthen the muscles that support joints that are affected by arthritis. They can be performed with weights or with exercise bands to add resistance. ? Aerobic activities. These are exercises, such as brisk walking or water aerobics, that get your heart pumping. ? Range-of-motion activities. These keep your joints easy to move. ? Balance and agility exercises. Managing pain, stiffness, and swelling      If directed, apply heat to the affected area as often as told by your health care provider. Use the heat source that your health care provider recommends, such as a moist heat pack or a heating pad. ? If you have a removable assistive device, remove it as told by your health care provider. ? Place a towel between your skin and the heat source. If your health care provider tells you to keep the assistive device on while you apply heat, place a towel between the assistive device and the heat source. ? Leave the heat on for 20-30 minutes. ? Remove the heat if your skin turns bright red. This is especially important if you are unable to feel pain, heat, or cold. You may have a greater risk of getting burned.  If directed, put ice on the affected joint: ? If you have a removable assistive device, remove it as told by your health care provider. ? Put ice in a plastic bag. ? Place a towel between your skin and the bag. If your health care provider tells you to keep the assistive device on during icing, place a towel between the assistive device and the bag. ? Leave the ice on for 20 minutes, 2-3 times a day. General instructions  Take over-the-counter and prescription medicines only as told by your health care provider.  Maintain a healthy weight. Follow instructions from your health care provider for weight control. These may include dietary restrictions.  Do not use  any products that contain nicotine or tobacco, such as cigarettes and e-cigarettes. These can delay bone healing. If you need help quitting, ask your health care provider.  Use assistive devices as directed by your health care provider.  Keep all follow-up visits as told by your health care provider. This is important. Where to find more information  [employer] of Arthritis and Musculoskeletal and Skin Diseases: www.niams.SouthExposed.es  [employer] on Aging: http://kim-miller.com/  American College of Rheumatology: www.rheumatology.org Contact a health care provider if:  Your skin turns red.  You develop a rash.  You have pain that gets worse.  You have a fever along with joint or muscle aches. Get help right away if:  You lose a lot of weight.  You suddenly lose your appetite.  You have night sweats. Summary  Osteoarthritis is a type of arthritis that affects tissue covering the ends of bones in joints (cartilage).  This condition is caused by age-related wearing down of cartilage that covers the ends of bones.  The main symptom of this condition is pain, swelling, and stiffness in the  joint.  There is no cure for this condition, but treatment can help to control pain and improve joint function. This information is not intended to replace advice given to you by your health care provider. Make sure you discuss any questions you have with your health care provider. Document Released: 02/24/2005 Document Revised: 02/06/2017 Document Reviewed: 10/29/2015 Elsevier Patient Education  2020 [employer].

## 2019-04-05 ENCOUNTER — Encounter (INDEPENDENT_AMBULATORY_CARE_PROVIDER_SITE_OTHER): Payer: Self-pay | Admitting: *Deleted

## 2019-07-25 ENCOUNTER — Other Ambulatory Visit (INDEPENDENT_AMBULATORY_CARE_PROVIDER_SITE_OTHER): Payer: Self-pay | Admitting: *Deleted

## 2019-07-25 ENCOUNTER — Ambulatory Visit (INDEPENDENT_AMBULATORY_CARE_PROVIDER_SITE_OTHER): Payer: Self-pay

## 2019-07-25 ENCOUNTER — Encounter (INDEPENDENT_AMBULATORY_CARE_PROVIDER_SITE_OTHER): Payer: Self-pay | Admitting: *Deleted

## 2019-07-25 ENCOUNTER — Telehealth (INDEPENDENT_AMBULATORY_CARE_PROVIDER_SITE_OTHER): Payer: Self-pay | Admitting: *Deleted

## 2019-07-25 ENCOUNTER — Other Ambulatory Visit: Payer: Self-pay

## 2019-07-25 DIAGNOSIS — Z1211 Encounter for screening for malignant neoplasm of colon: Secondary | ICD-10-CM

## 2019-07-25 NOTE — Telephone Encounter (Signed)
Referring MD/PCP: addis   Procedure: tcs  Reason/Indication:  screening  Has patient had this procedure before?  Yes, 15 yrs ago  If so, when, by whom and where?    Is there a family history of colon cancer?  no  Who?  What age when diagnosed?    Is patient diabetic?   no      Does patient have prosthetic heart valve or mechanical valve?  no  Do you have a pacemaker/defibrillator?  no  Has patient ever had endocarditis/atrial fibrillation? no  Does patient use oxygen? no  Has patient had joint replacement within last 12 months?  no  Is patient constipated or do they take laxatives? no  Does patient have a history of alcohol/drug use?  no  Is patient on blood thinner such as Coumadin, Plavix and/or Aspirin? no  Medications: hctz 25 mg daily, losartan 100 mg daily, pravastatin 40 mg daily, tamsulosin 4 mg daily, vit d daily,testosterone 200 mg daily  Allergies: nkda  Medication Adjustment per Dr Laural Golden:   Procedure date & time: 08/01/19

## 2019-07-29 ENCOUNTER — Other Ambulatory Visit: Payer: Self-pay

## 2019-07-29 ENCOUNTER — Other Ambulatory Visit (HOSPITAL_COMMUNITY)
Admission: RE | Admit: 2019-07-29 | Discharge: 2019-07-29 | Disposition: A | Discharge status: Home / Self Care | Payer: Medicare PPO | Source: Ambulatory Visit | Attending: Internal Medicine | Admitting: Internal Medicine

## 2019-07-29 DIAGNOSIS — Z01812 Encounter for preprocedural laboratory examination: Secondary | ICD-10-CM | POA: Insufficient documentation

## 2019-07-29 DIAGNOSIS — Z20822 Contact with and (suspected) exposure to covid-19: Secondary | ICD-10-CM | POA: Insufficient documentation

## 2019-07-29 LAB — SARS CORONAVIRUS 2 (TAT 6-24 HRS): SARS Coronavirus 2: NEGATIVE

## 2019-07-29 NOTE — Telephone Encounter (Signed)
Ok to schedule.

## 2019-08-01 ENCOUNTER — Encounter (HOSPITAL_COMMUNITY)
Admission: RE | Disposition: A | Discharge status: Home / Self Care | Payer: Self-pay | Source: Home / Self Care | Attending: Internal Medicine

## 2019-08-01 ENCOUNTER — Other Ambulatory Visit: Payer: Self-pay

## 2019-08-01 ENCOUNTER — Ambulatory Visit (HOSPITAL_COMMUNITY)
Admission: RE | Admit: 2019-08-01 | Discharge: 2019-08-01 | Disposition: A | Discharge status: Home / Self Care | Payer: Medicare PPO | Attending: Internal Medicine | Admitting: Internal Medicine

## 2019-08-01 DIAGNOSIS — Z7951 Long term (current) use of inhaled steroids: Secondary | ICD-10-CM | POA: Diagnosis not present

## 2019-08-01 DIAGNOSIS — K5730 Diverticulosis of large intestine without perforation or abscess without bleeding: Secondary | ICD-10-CM

## 2019-08-01 DIAGNOSIS — Z1211 Encounter for screening for malignant neoplasm of colon: Secondary | ICD-10-CM | POA: Insufficient documentation

## 2019-08-01 DIAGNOSIS — I10 Essential (primary) hypertension: Secondary | ICD-10-CM | POA: Diagnosis not present

## 2019-08-01 DIAGNOSIS — E785 Hyperlipidemia, unspecified: Secondary | ICD-10-CM | POA: Diagnosis not present

## 2019-08-01 DIAGNOSIS — K644 Residual hemorrhoidal skin tags: Secondary | ICD-10-CM | POA: Diagnosis not present

## 2019-08-01 DIAGNOSIS — Z79899 Other long term (current) drug therapy: Secondary | ICD-10-CM | POA: Insufficient documentation

## 2019-08-01 DIAGNOSIS — D122 Benign neoplasm of ascending colon: Secondary | ICD-10-CM | POA: Insufficient documentation

## 2019-08-01 HISTORY — PX: COLONOSCOPY: SHX5424

## 2019-08-01 HISTORY — PX: BIOPSY: SHX5522

## 2019-08-01 SURGERY — COLONOSCOPY
Anesthesia: Moderate Sedation

## 2019-08-01 MED ORDER — MEPERIDINE HCL 50 MG/ML IJ SOLN
INTRAMUSCULAR | Status: AC
  Filled 2019-08-01: qty 1

## 2019-08-01 MED ORDER — MIDAZOLAM HCL 5 MG/5ML IJ SOLN
INTRAMUSCULAR | Status: AC
  Filled 2019-08-01: qty 10

## 2019-08-01 MED ORDER — SODIUM CHLORIDE 0.9 % IV SOLN: INTRAVENOUS | Status: DC

## 2019-08-01 MED ORDER — MEPERIDINE HCL 50 MG/ML IJ SOLN
INTRAMUSCULAR | Status: DC | PRN
  Administered 2019-08-01 (×2): 25 mg

## 2019-08-01 MED ORDER — MIDAZOLAM HCL 5 MG/5ML IJ SOLN
INTRAMUSCULAR | Status: DC | PRN
  Administered 2019-08-01 (×2): 2 mg via INTRAVENOUS

## 2019-08-01 NOTE — Op Note (Signed)
[facility] Patient Name: Scott Savage Procedure Date: 08/01/2019 7:05 AM MRN: [id number] Date of Birth: [birth year] Attending MD: Hildred Laser , MD CSN: [id number] Age: 64 Admit Type: Outpatient Procedure:                Colonoscopy Indications:              Screening for colorectal malignant neoplasm Providers:                Hildred Laser, MD, Janeece Riggers, RN, Nelma Rothman,                            Technician Referring MD:             Michell Heinrich, DO Medicines:                Meperidine 50 mg IV, Midazolam 4 mg IV Complications:            No immediate complications. Estimated Blood Loss:     Estimated blood loss was minimal. Procedure:                Pre-Anesthesia Assessment:                           - Prior to the procedure, a History and Physical                            was performed, and patient medications and                            allergies were reviewed. The patient's tolerance of                            previous anesthesia was also reviewed. The risks                            and benefits of the procedure and the sedation                            options and risks were discussed with the patient.                            All questions were answered, and informed consent                            was obtained. Prior Anticoagulants: The patient has                            taken no previous anticoagulant or antiplatelet                            agents. ASA Grade Assessment: II - A patient with                            mild systemic disease. After reviewing the risks  and benefits, the patient was deemed in                            satisfactory condition to undergo the procedure.                           After obtaining informed consent, the colonoscope                            was passed under direct vision. Throughout the                            procedure, the patient's blood pressure, pulse, and                  oxygen saturations were monitored continuously. The                            [id number]-[id number] ([id number]) was introduced through the                            anus and advanced to the the cecum, identified by                            appendiceal orifice and ileocecal valve. The                            colonoscopy was performed without difficulty. The                            patient tolerated the procedure well. The quality                            of the bowel preparation was good. The ileocecal                            valve, appendiceal orifice, and rectum were                            photographed. Scope In: 7:40:22 AM Scope Out: 7:51:47 AM Scope Withdrawal Time: 0 hours 9 minutes 17 seconds  Total Procedure Duration: 0 hours 11 minutes 25 seconds  Findings:      The perianal and digital rectal examinations were normal.      A few diverticula were found in the sigmoid colon, hepatic flexure and       cecum.      A diminutive polyp was found in the ascending colon. Biopsies were taken       with a cold forceps for histology.      External hemorrhoids were found during retroflexion. The hemorrhoids       were small. Impression:               - Diverticulosis in the sigmoid colon, at the                            hepatic flexure and in the cecum.                           -  One diminutive polyp in the ascending colon.                            Biopsied.                           - External hemorrhoids. Moderate Sedation:      Moderate (conscious) sedation was administered by the endoscopy nurse       and supervised by the endoscopist. The following parameters were       monitored: oxygen saturation, heart rate, blood pressure, CO2       capnography and response to care. Total physician intraservice time was       15 minutes. Recommendation:           - Patient has a contact number available for                            emergencies. The signs and symptoms  of potential                            delayed complications were discussed with the                            patient. Return to normal activities tomorrow.                            Written discharge instructions were provided to the                            patient.                           - High fiber diet and low sodium diet today.                           - Continue present medications.                           - No aspirin, ibuprofen, naproxen, or other                            non-steroidal anti-inflammatory drugs for 1 day.                           - Await pathology results.                           - Repeat colonoscopy is recommended. The                            colonoscopy date will be determined after pathology                            results from today's exam become available for  review. Procedure Code(s):        --- Professional ---                           [phone], Colonoscopy, flexible; with biopsy, single                            or multiple                           G0500, Moderate sedation services provided by the                            same physician or other qualified health care                            professional performing a gastrointestinal                            endoscopic service that sedation supports,                            requiring the presence of an independent trained                            observer to assist in the monitoring of the                            patient's level of consciousness and physiological                            status; initial 15 minutes of intra-service time;                            patient age 72 years or older (additional time may                            be reported with [id number], as appropriate) Diagnosis Code(s):        --- Professional ---                           Z12.11, Encounter for screening for malignant                            neoplasm of colon                            K64.4, Residual hemorrhoidal skin tags                           K63.5, Polyp of colon                           K57.30, Diverticulosis of large intestine without  perforation or abscess without bleeding CPT copyright 2019 [employer]. All rights reserved. The codes documented in this report are preliminary and upon coder review may  be revised to meet current compliance requirements. Hildred Laser, MD Hildred Laser, MD 08/01/2019 8:11:00 AM This report has been signed electronically. Number of Addenda: 0

## 2019-08-01 NOTE — H&P (Addendum)
Scott Savage is an 64 y.o. male.   Chief Complaint: Patient is here for colonoscopy. HPI: Patient is 64 year old African-American male who is here for screening colonoscopy. His last exam was normal 15 years ago.  He denies abdominal pain change in bowel habits or rectal bleeding.  He does not take aspirin or anticoagulants. Family history is positive for lung cancer in her brother and her sister and father had either primary or metastatic liver cancer.  No history of colon cancer.  Past Medical History:  Diagnosis Date  . HTN (hypertension)   . Hyperlipemia   . PONV (postoperative nausea and vomiting)   . Urinary complications    Uses flomax to help empty bladder        Bronchospasm  Past Surgical History:  Procedure Laterality Date  . left foot  2011   [facility]  . left shoulder  2008   Short Stay in [place]  . RESECTION DISTAL CLAVICAL  07/04/2011   Procedure: RESECTION DISTAL CLAVICAL;  Surgeon: Carole Civil, MD;  Location: [facility];  Service: Orthopedics;  Laterality: Right;  . SHOULDER OPEN ROTATOR CUFF REPAIR  07/04/2011   Procedure: ROTATOR CUFF REPAIR SHOULDER OPEN;  Surgeon: Carole Civil, MD;  Location: [facility];  Service: Orthopedics;  Laterality: Right;    Family History  Problem Relation Age of Onset  . ESRD Mother   . Cancer; liver Father   . Cancer ;lung Sister   . Cancer; lung Brother   . Arthritis Other   . Kidney disease Other    Social History:  reports that he has never smoked. He has never used smokeless tobacco. He reports that he does not drink alcohol or use drugs.  Allergies:  Allergies  Allergen Reactions  . Hydrocodone Itching and Nausea And Vomiting    Medications Prior to Admission  Medication Sig Dispense Refill  . acetaminophen (TYLENOL) 500 MG tablet Take 1,000 mg by mouth every 6 (six) hours as needed for moderate pain or headache.    . albuterol (VENTOLIN HFA) 108 (90 Base) MCG/ACT inhaler Inhale 2 puffs into  the lungs every 6 (six) hours as needed for wheezing or shortness of breath.    Marland Kitchen BREO ELLIPTA 100-25 MCG/INH AEPB Inhale 1 puff into the lungs daily.    . cholecalciferol (VITAMIN D3) 25 MCG (1000 UNIT) tablet Take 1,000 Units by mouth daily.    . hydrochlorothiazide (HYDRODIURIL) 25 MG tablet Take 25 mg by mouth daily.    Marland Kitchen losartan (COZAAR) 100 MG tablet Take 100 mg by mouth daily.     . pravastatin (PRAVACHOL) 40 MG tablet Take 40 mg by mouth daily.    . Tamsulosin HCl (FLOMAX) 0.4 MG CAPS Take 0.4 mg by mouth at bedtime.     Marland Kitchen testosterone cypionate (DEPOTESTOSTERONE CYPIONATE) 200 MG/ML injection Inject 200 mg into the muscle every 28 (twenty-eight) days.       No results found for this or any previous visit (from the past 48 hour(s)). No results found.  Review of Systems  Blood pressure (!) 149/73, pulse (!) 49, temperature 97.7 F (36.5 C), temperature source Oral, resp. rate 14, height 6' (1.829 m), SpO2 98 %. Physical Exam  Constitutional: He appears well-developed and well-nourished.  HENT:  Mouth/Throat: Oropharynx is clear and moist.  Eyes: No scleral icterus.  Slight right ptosis  Neck: No thyromegaly present.  Cardiovascular: Normal rate, regular rhythm and normal heart sounds.  No murmur heard. Respiratory: Effort normal and breath  sounds normal.  GI: Soft. He exhibits no distension and no mass. There is no abdominal tenderness.  Musculoskeletal:        General: No edema.  Lymphadenopathy:    He has no cervical adenopathy.  Neurological: He is alert.  Skin: Skin is warm and dry.     Assessment/Plan Average risk screening colonoscopy.  Hildred Laser, MD 08/01/2019, 7:29 AM

## 2019-08-01 NOTE — Discharge Instructions (Signed)
No aspirin or NSAIDs for 24 hours. Resume other medications as before. High-fiber diet. No driving for 24 hours. Physician will call with biopsy results.   Colonoscopy, Adult, Care After This sheet gives you information about how to care for yourself after your procedure. Your health care provider may also give you more specific instructions. If you have problems or questions, contact your health care provider. What can I expect after the procedure? After the procedure, it is common to have:  A small amount of blood in your stool for 24 hours after the procedure.  Some gas.  Mild cramping or bloating of your abdomen. Follow these instructions at home: Eating and drinking   Drink enough fluid to keep your urine pale yellow.  Follow instructions from your health care provider about eating or drinking restrictions.  Resume your normal diet as instructed by your health care provider. Avoid heavy or fried foods that are hard to digest. Activity  Rest as told by your health care provider.  Avoid sitting for a long time without moving. Get up to take short walks every 1-2 hours. This is important to improve blood flow and breathing. Ask for help if you feel weak or unsteady.  Return to your normal activities as told by your health care provider. Ask your health care provider what activities are safe for you. Managing cramping and bloating   Try walking around when you have cramps or feel bloated.  Apply heat to your abdomen as told by your health care provider. Use the heat source that your health care provider recommends, such as a moist heat pack or a heating pad. ? Place a towel between your skin and the heat source. ? Leave the heat on for 20-30 minutes. ? Remove the heat if your skin turns bright red. This is especially important if you are unable to feel pain, heat, or cold. You may have a greater risk of getting burned. General instructions  For the first 24 hours after  the procedure: ? Do not drive or use machinery. ? Do not sign important documents. ? Do not drink alcohol. ? Do your regular daily activities at a slower pace than normal. ? Eat soft foods that are easy to digest.  Take over-the-counter and prescription medicines only as told by your health care provider.  Keep all follow-up visits as told by your health care provider. This is important. Contact a health care provider if:  You have blood in your stool 2-3 days after the procedure. Get help right away if you have:  More than a small spotting of blood in your stool.  Large blood clots in your stool.  Swelling of your abdomen.  Nausea or vomiting.  A fever.  Increasing pain in your abdomen that is not relieved with medicine. Summary  After the procedure, it is common to have a small amount of blood in your stool. You may also have mild cramping and bloating of your abdomen.  For the first 24 hours after the procedure, do not drive or use machinery, sign important documents, or drink alcohol.  Get help right away if you have a lot of blood in your stool, nausea or vomiting, a fever, or increased pain in your abdomen. This information is not intended to replace advice given to you by your health care provider. Make sure you discuss any questions you have with your health care provider. Document Revised: 09/20/2018 Document Reviewed: 09/20/2018 Elsevier Patient Education  [place].  Colon Polyps  Polyps are tissue growths inside the body. Polyps can grow in many places, including the large intestine (colon). A polyp may be a round bump or a mushroom-shaped growth. You could have one polyp or several. Most colon polyps are noncancerous (benign). However, some colon polyps can become cancerous over time. Finding and removing the polyps early can help prevent this. What are the causes? The exact cause of colon polyps is not known. What increases the risk? You are more  likely to develop this condition if you:  Have a family history of colon cancer or colon polyps.  Are older than 60 or older than 45 if you are African American.  Have inflammatory bowel disease, such as ulcerative colitis or Crohn's disease.  Have certain hereditary conditions, such as: ? Familial adenomatous polyposis. ? Lynch syndrome. ? Turcot syndrome. ? Peutz-Jeghers syndrome.  Are overweight.  Smoke cigarettes.  Do not get enough exercise.  Drink too much alcohol.  Eat a diet that is high in fat and red meat and low in fiber.  Had childhood cancer that was treated with abdominal radiation. What are the signs or symptoms? Most polyps do not cause symptoms. If you have symptoms, they may include:  Blood coming from your rectum when having a bowel movement.  Blood in your stool. The stool may look dark red or black.  Abdominal pain.  A change in bowel habits, such as constipation or diarrhea. How is this diagnosed? This condition is diagnosed with a colonoscopy. This is a procedure in which a lighted, flexible scope is inserted into the anus and then passed into the colon to examine the area. Polyps are sometimes found when a colonoscopy is done as part of routine cancer screening tests. How is this treated? Treatment for this condition involves removing any polyps that are found. Most polyps can be removed during a colonoscopy. Those polyps will then be tested for cancer. Additional treatment may be needed depending on the results of testing. Follow these instructions at home: Lifestyle  Maintain a healthy weight, or lose weight if recommended by your health care provider.  Exercise every day or as told by your health care provider.  Do not use any products that contain nicotine or tobacco, such as cigarettes and e-cigarettes. If you need help quitting, ask your health care provider.  If you drink alcohol, limit how much you have: ? 0-1 drink a day for  women. ? 0-2 drinks a day for men.  Be aware of how much alcohol is in your drink. In the [place] one drink equals one 12 oz bottle of beer (355 mL), one 5 oz glass of wine (148 mL), or one 1 oz shot of hard liquor (44 mL). Eating and drinking   Eat foods that are high in fiber, such as fruits, vegetables, and whole grains.  Eat foods that are high in calcium and vitamin D, such as milk, cheese, yogurt, eggs, liver, fish, and broccoli.  Limit foods that are high in fat, such as fried foods and desserts.  Limit the amount of red meat and processed meat you eat, such as hot dogs, sausage, bacon, and lunch meats. General instructions  Keep all follow-up visits as told by your health care provider. This is important. ? This includes having regularly scheduled colonoscopies. ? Talk to your health care provider about when you need a colonoscopy. Contact a health care provider if:  You have new or worsening bleeding during a bowel movement.  You have new or increased blood in your stool.  You have a change in bowel habits.  You lose weight for no known reason. Summary  Polyps are tissue growths inside the body. Polyps can grow in many places, including the colon.  Most colon polyps are noncancerous (benign), but some can become cancerous over time.  This condition is diagnosed with a colonoscopy.  Treatment for this condition involves removing any polyps that are found. Most polyps can be removed during a colonoscopy. This information is not intended to replace advice given to you by your health care provider. Make sure you discuss any questions you have with your health care provider. Document Revised: 06/11/2017 Document Reviewed: 06/11/2017 Elsevier Patient Education  [place].  Diverticulosis  Diverticulosis is a condition that develops when small pouches (diverticula) form in the wall of the large intestine (colon). The colon is where water is absorbed and stool  (feces) is formed. The pouches form when the inside layer of the colon pushes through weak spots in the outer layers of the colon. You may have a few pouches or many of them. The pouches usually do not cause problems unless they become inflamed or infected. When this happens, the condition is called diverticulitis. What are the causes? The cause of this condition is not known. What increases the risk? The following factors may make you more likely to develop this condition:  Being older than age 22. Your risk for this condition increases with age. Diverticulosis is rare among people younger than age 68. By age 33, many people have it.  Eating a low-fiber diet.  Having frequent constipation.  Being overweight.  Not getting enough exercise.  Smoking.  Taking over-the-counter pain medicines, like aspirin and ibuprofen.  Having a family history of diverticulosis. What are the signs or symptoms? In most people, there are no symptoms of this condition. If you do have symptoms, they may include:  Bloating.  Cramps in the abdomen.  Constipation or diarrhea.  Pain in the lower left side of the abdomen. How is this diagnosed? Because diverticulosis usually has no symptoms, it is most often diagnosed during an exam for other colon problems. The condition may be diagnosed by:  Using a flexible scope to examine the colon (colonoscopy).  Taking an X-ray of the colon after dye has been put into the colon (barium enema).  Having a CT scan. How is this treated? You may not need treatment for this condition. Your health care provider may recommend treatment to prevent problems. You may need treatment if you have symptoms or if you previously had diverticulitis. Treatment may include:  Eating a high-fiber diet.  Taking a fiber supplement.  Taking a live bacteria supplement (probiotic).  Taking medicine to relax your colon. Follow these instructions at home: Medicines  Take  over-the-counter and prescription medicines only as told by your health care provider.  If told by your health care provider, take a fiber supplement or probiotic. Constipation prevention Your condition may cause constipation. To prevent or treat constipation, you may need to:  Drink enough fluid to keep your urine pale yellow.  Take over-the-counter or prescription medicines.  Eat foods that are high in fiber, such as beans, whole grains, and fresh fruits and vegetables.  Limit foods that are high in fat and processed sugars, such as fried or sweet foods.  General instructions  Try not to strain when you have a bowel movement.  Keep all follow-up visits as told by your  health care provider. This is important. Contact a health care provider if you:  Have pain in your abdomen.  Have bloating.  Have cramps.  Have not had a bowel movement in 3 days. Get help right away if:  Your pain gets worse.  Your bloating becomes very bad.  You have a fever or chills, and your symptoms suddenly get worse.  You vomit.  You have bowel movements that are bloody or black.  You have bleeding from your rectum. Summary  Diverticulosis is a condition that develops when small pouches (diverticula) form in the wall of the large intestine (colon).  You may have a few pouches or many of them.  This condition is most often diagnosed during an exam for other colon problems.  Treatment may include increasing the fiber in your diet, taking supplements, or taking medicines. This information is not intended to replace advice given to you by your health care provider. Make sure you discuss any questions you have with your health care provider. Document Revised: 09/23/2018 Document Reviewed: 09/23/2018 Elsevier Patient Education  [place].   High-Fiber Diet Fiber, also called dietary fiber, is a type of carbohydrate that is found in fruits, vegetables, whole grains, and beans. A  high-fiber diet can have many health benefits. Your health care provider may recommend a high-fiber diet to help:  Prevent constipation. Fiber can make your bowel movements more regular.  Lower your cholesterol.  Relieve the following conditions: ? Swelling of veins in the anus (hemorrhoids). ? Swelling and irritation (inflammation) of specific areas of the digestive tract (uncomplicated diverticulosis). ? A problem of the large intestine (colon) that sometimes causes pain and diarrhea (irritable bowel syndrome, IBS).  Prevent overeating as part of a weight-loss plan.  Prevent heart disease, type 2 diabetes, and certain cancers. What is my plan? The recommended daily fiber intake in grams (g) includes:  38 g for men age 48 or younger.  30 g for men over age 5.  17 g for women age 73 or younger.  21 g for women over age 74. You can get the recommended daily intake of dietary fiber by:  Eating a variety of fruits, vegetables, grains, and beans.  Taking a fiber supplement, if it is not possible to get enough fiber through your diet. What do I need to know about a high-fiber diet?  It is better to get fiber through food sources rather than from fiber supplements. There is not a lot of research about how effective supplements are.  Always check the fiber content on the nutrition facts label of any prepackaged food. Look for foods that contain 5 g of fiber or more per serving.  Talk with a diet and nutrition specialist (dietitian) if you have questions about specific foods that are recommended or not recommended for your medical condition, especially if those foods are not listed below.  Gradually increase how much fiber you consume. If you increase your intake of dietary fiber too quickly, you may have bloating, cramping, or gas.  Drink plenty of water. Water helps you to digest fiber. What are tips for following this plan?  Eat a wide variety of high-fiber foods.  Make sure  that half of the grains that you eat each day are whole grains.  Eat breads and cereals that are made with whole-grain flour instead of refined flour or white flour.  Eat brown rice, bulgur wheat, or millet instead of white rice.  Start the day with a breakfast that  is high in fiber, such as a cereal that contains 5 g of fiber or more per serving.  Use beans in place of meat in soups, salads, and pasta dishes.  Eat high-fiber snacks, such as berries, raw vegetables, nuts, and popcorn.  Choose whole fruits and vegetables instead of processed forms like juice or sauce. What foods can I eat?  Fruits Berries. Pears. Apples. Oranges. Avocado. Prunes and raisins. Dried figs. Vegetables Sweet potatoes. Spinach. Kale. Artichokes. Cabbage. Broccoli. Cauliflower. Green peas. Carrots. Squash. Grains Whole-grain breads. Multigrain cereal. Oats and oatmeal. Brown rice. Barley. Bulgur wheat. [place]. Quinoa. Bran muffins. Popcorn. Rye wafer crackers. Meats and other proteins [place], kidney, and pinto beans. Soybeans. Split peas. Lentils. Nuts and seeds. Dairy Fiber-fortified yogurt. Beverages Fiber-fortified soy milk. Fiber-fortified orange juice. Other foods Fiber bars. The items listed above may not be a complete list of recommended foods and beverages. Contact a dietitian for more options. What foods are not recommended? Fruits Fruit juice. Cooked, strained fruit. Vegetables Fried potatoes. Canned vegetables. Well-cooked vegetables. Grains White bread. Pasta made with refined flour. White rice. Meats and other proteins Fatty cuts of meat. Fried chicken or fried fish. Dairy Milk. Yogurt. Cream cheese. Sour cream. Fats and oils Butters. Beverages Soft drinks. Other foods Cakes and pastries. The items listed above may not be a complete list of foods and beverages to avoid. Contact a dietitian for more information. Summary  Fiber is a type of carbohydrate. It is found in fruits,  vegetables, whole grains, and beans.  There are many health benefits of eating a high-fiber diet, such as preventing constipation, lowering blood cholesterol, helping with weight loss, and reducing your risk of heart disease, diabetes, and certain cancers.  Gradually increase your intake of fiber. Increasing too fast can result in cramping, bloating, and gas. Drink plenty of water while you increase your fiber.  The best sources of fiber include whole fruits and vegetables, whole grains, nuts, seeds, and beans. This information is not intended to replace advice given to you by your health care provider. Make sure you discuss any questions you have with your health care provider. Document Revised: 12/29/2016 Document Reviewed: 12/29/2016 Elsevier Patient Education  2020 [employer].   Hemorrhoids Hemorrhoids are swollen veins in and around the rectum or anus. There are two types of hemorrhoids:  Internal hemorrhoids. These occur in the veins that are just inside the rectum. They may poke through to the outside and become irritated and painful.  External hemorrhoids. These occur in the veins that are outside the anus and can be felt as a painful swelling or hard lump near the anus. Most hemorrhoids do not cause serious problems, and they can be managed with home treatments such as diet and lifestyle changes. If home treatments do not help the symptoms, procedures can be done to shrink or remove the hemorrhoids. What are the causes? This condition is caused by increased pressure in the anal area. This pressure may result from various things, including:  Constipation.  Straining to have a bowel movement.  Diarrhea.  Pregnancy.  Obesity.  Sitting for long periods of time.  Heavy lifting or other activity that causes you to strain.  Anal sex.  Riding a bike for a long period of time. What are the signs or symptoms? Symptoms of this condition include:  Pain.  Anal itching or  irritation.  Rectal bleeding.  Leakage of stool (feces).  Anal swelling.  One or more lumps around the anus. How is this  diagnosed? This condition can often be diagnosed through a visual exam. Other exams or tests may also be done, such as:  An exam that involves feeling the rectal area with a gloved hand (digital rectal exam).  An exam of the anal canal that is done using a small tube (anoscope).  A blood test, if you have lost a significant amount of blood.  A test to look inside the colon using a flexible tube with a camera on the end (sigmoidoscopy or colonoscopy). How is this treated? This condition can usually be treated at home. However, various procedures may be done if dietary changes, lifestyle changes, and other home treatments do not help your symptoms. These procedures can help make the hemorrhoids smaller or remove them completely. Some of these procedures involve surgery, and others do not. Common procedures include:  Rubber band ligation. Rubber bands are placed at the base of the hemorrhoids to cut off their blood supply.  Sclerotherapy. Medicine is injected into the hemorrhoids to shrink them.  Infrared coagulation. A type of light energy is used to get rid of the hemorrhoids.  Hemorrhoidectomy surgery. The hemorrhoids are surgically removed, and the veins that supply them are tied off.  Stapled hemorrhoidopexy surgery. The surgeon staples the base of the hemorrhoid to the rectal wall. Follow these instructions at home: Eating and drinking   Eat foods that have a lot of fiber in them, such as whole grains, beans, nuts, fruits, and vegetables.  Ask your health care provider about taking products that have added fiber (fiber supplements).  Reduce the amount of fat in your diet. You can do this by eating low-fat dairy products, eating less red meat, and avoiding processed foods.  Drink enough fluid to keep your urine pale yellow. Managing pain and  swelling   Take warm sitz baths for 20 minutes, 3-4 times a day to ease pain and discomfort. You may do this in a bathtub or using a portable sitz bath that fits over the toilet.  If directed, apply ice to the affected area. Using ice packs between sitz baths may be helpful. ? Put ice in a plastic bag. ? Place a towel between your skin and the bag. ? Leave the ice on for 20 minutes, 2-3 times a day. General instructions  Take over-the-counter and prescription medicines only as told by your health care provider.  Use medicated creams or suppositories as told.  Get regular exercise. Ask your health care provider how much and what kind of exercise is best for you. In general, you should do moderate exercise for at least 30 minutes on most days of the week (150 minutes each week). This can include activities such as walking, biking, or yoga.  Go to the bathroom when you have the urge to have a bowel movement. Do not wait.  Avoid straining to have bowel movements.  Keep the anal area dry and clean. Use wet toilet paper or moist towelettes after a bowel movement.  Do not sit on the toilet for long periods of time. This increases blood pooling and pain.  Keep all follow-up visits as told by your health care provider. This is important. Contact a health care provider if you have:  Increasing pain and swelling that are not controlled by treatment or medicine.  Difficulty having a bowel movement, or you are unable to have a bowel movement.  Pain or inflammation outside the area of the hemorrhoids. Get help right away if you have:  Uncontrolled bleeding  from your rectum. Summary  Hemorrhoids are swollen veins in and around the rectum or anus.  Most hemorrhoids can be managed with home treatments such as diet and lifestyle changes.  Taking warm sitz baths can help ease pain and discomfort.  In severe cases, procedures or surgery can be done to shrink or remove the hemorrhoids. This  information is not intended to replace advice given to you by your health care provider. Make sure you discuss any questions you have with your health care provider. Document Revised: 07/23/2018 Document Reviewed: 07/16/2017 Elsevier Patient Education  [place].

## 2019-08-02 LAB — SURGICAL PATHOLOGY

## 2021-02-28 ENCOUNTER — Encounter: Payer: Self-pay | Admitting: Orthopedic Surgery

## 2021-02-28 ENCOUNTER — Other Ambulatory Visit: Payer: Self-pay

## 2021-02-28 ENCOUNTER — Ambulatory Visit: Payer: Medicare PPO

## 2021-02-28 ENCOUNTER — Ambulatory Visit: Payer: Medicare PPO | Admitting: Orthopedic Surgery

## 2021-02-28 VITALS — BP 161/80 | HR 73 | Ht 72.0 in | Wt 224.0 lb

## 2021-02-28 DIAGNOSIS — M25561 Pain in right knee: Secondary | ICD-10-CM

## 2021-02-28 DIAGNOSIS — G8929 Other chronic pain: Secondary | ICD-10-CM

## 2021-02-28 DIAGNOSIS — M25461 Effusion, right knee: Secondary | ICD-10-CM | POA: Diagnosis not present

## 2021-02-28 MED ORDER — MELOXICAM 7.5 MG PO TABS
7.5000 mg | ORAL_TABLET | Freq: Every day | ORAL | 5 refills | Status: DC
Start: 2021-02-28 — End: 2021-06-27

## 2021-02-28 NOTE — Progress Notes (Signed)
Chief Complaint  Patient presents with   Knee Pain    RT knee Stepped wrong and twisted  DOI 02/23/21   65 year old male slipped in the basement going off last stepped and then the knee did something funny and since that time he has had pain swelling and decreased range of motion  He did try some ice and a knee sleeve but continues to have pain comes in for evaluation and management  The right knee has large joint effusion is tender over the lateral side he has pain-free range of motion of 215 degrees and then he has pain in the knee is stable on drawer testing  He does also have some extension deficit of about 5 degrees  His x-ray shows basically that his joint is pretty much destroyed there are multiple loose bodies are not sure if they are new or old the joint spaces are narrowed and there are significant osteophytes  Recommend aspiration injection right knee  Start meloxicam  Return in 3 weeks  Procedure note  Procedure note injection and aspiration right knee joint  Verbal consent was obtained to aspirate and inject the right knee joint   Timeout was completed to confirm the site of aspiration and injection  An 18-gauge needle was used to aspirate the knee joint from a suprapatellar lateral approach.  The medications used were 40 mg of Depo-Medrol and 1% lidocaine 3 cc  Anesthesia was provided by ethyl chloride and the skin was prepped with alcohol.  After cleaning the skin with alcohol an 18-gauge needle was used to aspirate the right knee joint.  We obtained 50 cc of fluid yellow cloudy fluid  We follow this by injection of 40 mg of Depo-Medrol and 3 cc 1% lidocaine.  There were no complications. A sterile bandage was applied.  Meds ordered this encounter  Medications   meloxicam (MOBIC) 7.5 MG tablet    Sig: Take 1 tablet (7.5 mg total) by mouth daily.    Dispense:  30 tablet    Refill:  5

## 2021-03-14 ENCOUNTER — Ambulatory Visit: Payer: Medicare PPO | Admitting: Orthopedic Surgery

## 2021-03-21 ENCOUNTER — Ambulatory Visit (INDEPENDENT_AMBULATORY_CARE_PROVIDER_SITE_OTHER): Payer: Medicare PPO | Admitting: Orthopedic Surgery

## 2021-03-21 ENCOUNTER — Encounter: Payer: Self-pay | Admitting: Orthopedic Surgery

## 2021-03-21 ENCOUNTER — Other Ambulatory Visit: Payer: Self-pay

## 2021-03-21 DIAGNOSIS — G8929 Other chronic pain: Secondary | ICD-10-CM | POA: Diagnosis not present

## 2021-03-21 DIAGNOSIS — M25561 Pain in right knee: Secondary | ICD-10-CM | POA: Diagnosis not present

## 2021-03-21 NOTE — Progress Notes (Signed)
Chief Complaint  Patient presents with   Knee Pain    RT knee/improving   66 year old male status post aspiration injection right knee for knee effusion  He says his knee is back to normal  Reexamination of the right knee shows that there is no effusion he is regained his range of motion he is ambulating without a limp and no assistive devices  Recommend he come back when the knee acts up again

## 2021-05-30 ENCOUNTER — Encounter: Payer: Self-pay | Admitting: Orthopedic Surgery

## 2021-05-30 ENCOUNTER — Ambulatory Visit (INDEPENDENT_AMBULATORY_CARE_PROVIDER_SITE_OTHER): Payer: Medicare PPO | Admitting: Orthopedic Surgery

## 2021-05-30 ENCOUNTER — Other Ambulatory Visit: Payer: Self-pay

## 2021-05-30 VITALS — Ht 72.0 in | Wt 224.0 lb

## 2021-05-30 DIAGNOSIS — G8929 Other chronic pain: Secondary | ICD-10-CM | POA: Diagnosis not present

## 2021-05-30 DIAGNOSIS — M25461 Effusion, right knee: Secondary | ICD-10-CM | POA: Diagnosis not present

## 2021-05-30 DIAGNOSIS — M25561 Pain in right knee: Secondary | ICD-10-CM

## 2021-05-30 NOTE — Patient Instructions (Signed)
While we are working on your approval please go ahead and call to schedule your appointment with [facility] in at least one (1) week.  ° °Central Scheduling °[phone] °

## 2021-05-30 NOTE — Progress Notes (Signed)
Chief Complaint  ?Patient presents with  ? Knee Pain  ?  Rt knee pain and swelling that's gotten worse over the past 2 days.   ? ? ?Scott Savage a 66 years old presents with recurrent effusion pain and swelling right knee ? ?He had an aspiration injection back January 2023 on his presentation on the 12th he had improved however he comes back in today with pain and swelling again ? ?We did aspirate his right knee because of the pain and the warmth the swelling the decreased range of motion he was having ? ?We aspirated 70 cc of dark blood ? ?On x-ray he has moderate to severe arthritis that x-ray was done on December 22 ? ?Differential diagnosis now includes posttraumatic effusion which she says there was no trauma ? ?PVNS ? ?Severe synovitis ? ?Recommend MRI to evaluate follow-up after scan ? ?Procedure note injection and aspiration right knee joint ? ?Verbal consent was obtained to aspirate and inject the right knee joint  ? ?Timeout was completed to confirm the site of aspiration and injection ? ?An 18-gauge needle was used to aspirate the knee joint from a suprapatellar lateral approach. ? ?The medications used were 40 mg of Depo-Medrol and 1% lidocaine 3 cc ? ?Anesthesia was provided by ethyl chloride and the skin was prepped with alcohol. ? ?After cleaning the skin with alcohol an 18-gauge needle was used to aspirate the right knee joint. ? ?We obtained 70 cc of fluid it was dark bloody fluid ? ?We follow this by injection of 40 mg of Depo-Medrol and 3 cc 1% lidocaine. ? ?There were no complications. A sterile bandage was applied. ?  ?

## 2021-06-18 ENCOUNTER — Ambulatory Visit (HOSPITAL_COMMUNITY)
Admission: RE | Admit: 2021-06-18 | Discharge: 2021-06-18 | Disposition: A | Discharge status: Home / Self Care | Payer: Medicare PPO | Source: Ambulatory Visit | Attending: Orthopedic Surgery | Admitting: Orthopedic Surgery

## 2021-06-18 DIAGNOSIS — M25561 Pain in right knee: Secondary | ICD-10-CM | POA: Diagnosis present

## 2021-06-18 DIAGNOSIS — M25461 Effusion, right knee: Secondary | ICD-10-CM | POA: Diagnosis present

## 2021-06-18 DIAGNOSIS — G8929 Other chronic pain: Secondary | ICD-10-CM | POA: Insufficient documentation

## 2021-06-20 ENCOUNTER — Ambulatory Visit: Payer: Medicare PPO | Admitting: Orthopedic Surgery

## 2021-06-20 DIAGNOSIS — M1711 Unilateral primary osteoarthritis, right knee: Secondary | ICD-10-CM | POA: Diagnosis not present

## 2021-06-20 DIAGNOSIS — M1710 Unilateral primary osteoarthritis, unspecified knee: Secondary | ICD-10-CM

## 2021-06-20 DIAGNOSIS — M2341 Loose body in knee, right knee: Secondary | ICD-10-CM

## 2021-06-20 DIAGNOSIS — G8929 Other chronic pain: Secondary | ICD-10-CM

## 2021-06-20 DIAGNOSIS — M25461 Effusion, right knee: Secondary | ICD-10-CM

## 2021-06-20 MED ORDER — BUPIVACAINE-MELOXICAM ER 200-6 MG/7ML IJ SOLN: 400.0000 mg | Freq: Once | INTRAMUSCULAR | Status: DC

## 2021-06-20 NOTE — Progress Notes (Signed)
FOLLOW UP  ? ?Encounter Diagnoses  ?Name Primary?  ? Chronic pain of right knee Yes  ? Effusion, right knee   ? Primary localized osteoarthritis of knee-right knee   ? ? ? ?Chief Complaint  ?Patient presents with  ? Results  ?  Mri right knee   ? ? ? ?Scott Savage has had several aspirations and injections in his right knee he continued to have effusions and was sent for MRI of the right knee ? ?Outside images.  Independently interpreted as: His MRI shows extensive cartilage loss all 3 compartments meniscal tears medially and laterally as well as multiple loose bodies ? ?His plain films show destruction of the joint with grade 4 arthritis ? ?I discussed these results with him and he wants to discuss this with his wife he will either choose the knee replacement or the knee arthroscopy.  He knows that if he gets the knee scoped he will eventually have to have the knee replaced ? ?Tentative date is scheduled for May 19 and he will call us back to let us know which procedure he would like to go with. ?

## 2021-06-20 NOTE — Addendum Note (Signed)
Addended by: Carole Civil on: 06/20/2021 04:04 PM ? ? Modules accepted: Orders ? ?

## 2021-06-21 ENCOUNTER — Other Ambulatory Visit: Payer: Self-pay

## 2021-06-21 DIAGNOSIS — G8929 Other chronic pain: Secondary | ICD-10-CM

## 2021-06-21 DIAGNOSIS — M1710 Unilateral primary osteoarthritis, unspecified knee: Secondary | ICD-10-CM

## 2021-06-23 ENCOUNTER — Other Ambulatory Visit: Payer: Self-pay

## 2021-06-23 ENCOUNTER — Inpatient Hospital Stay (HOSPITAL_COMMUNITY)
Admission: EM | Admit: 2021-06-23 | Discharge: 2021-06-27 | DRG: 202 | Disposition: A | Discharge status: Home / Self Care | Payer: Medicare Other | Attending: Internal Medicine | Admitting: Internal Medicine

## 2021-06-23 ENCOUNTER — Encounter (HOSPITAL_COMMUNITY): Payer: Self-pay | Admitting: Emergency Medicine

## 2021-06-23 DIAGNOSIS — I129 Hypertensive chronic kidney disease with stage 1 through stage 4 chronic kidney disease, or unspecified chronic kidney disease: Secondary | ICD-10-CM | POA: Diagnosis present

## 2021-06-23 DIAGNOSIS — I248 Other forms of acute ischemic heart disease: Secondary | ICD-10-CM | POA: Diagnosis not present

## 2021-06-23 DIAGNOSIS — D631 Anemia in chronic kidney disease: Secondary | ICD-10-CM | POA: Diagnosis not present

## 2021-06-23 DIAGNOSIS — R6510 Systemic inflammatory response syndrome (SIRS) of non-infectious origin without acute organ dysfunction: Secondary | ICD-10-CM

## 2021-06-23 DIAGNOSIS — M1710 Unilateral primary osteoarthritis, unspecified knee: Secondary | ICD-10-CM

## 2021-06-23 DIAGNOSIS — Z79890 Hormone replacement therapy (postmenopausal): Secondary | ICD-10-CM | POA: Diagnosis not present

## 2021-06-23 DIAGNOSIS — N184 Chronic kidney disease, stage 4 (severe): Secondary | ICD-10-CM | POA: Diagnosis present

## 2021-06-23 DIAGNOSIS — D649 Anemia, unspecified: Secondary | ICD-10-CM | POA: Diagnosis present

## 2021-06-23 DIAGNOSIS — N179 Acute kidney failure, unspecified: Secondary | ICD-10-CM

## 2021-06-23 DIAGNOSIS — E785 Hyperlipidemia, unspecified: Secondary | ICD-10-CM | POA: Diagnosis not present

## 2021-06-23 DIAGNOSIS — E875 Hyperkalemia: Secondary | ICD-10-CM

## 2021-06-23 DIAGNOSIS — J189 Pneumonia, unspecified organism: Principal | ICD-10-CM

## 2021-06-23 DIAGNOSIS — D696 Thrombocytopenia, unspecified: Secondary | ICD-10-CM | POA: Diagnosis not present

## 2021-06-23 DIAGNOSIS — R7989 Other specified abnormal findings of blood chemistry: Secondary | ICD-10-CM | POA: Diagnosis present

## 2021-06-23 DIAGNOSIS — J209 Acute bronchitis, unspecified: Principal | ICD-10-CM

## 2021-06-23 DIAGNOSIS — I441 Atrioventricular block, second degree: Secondary | ICD-10-CM

## 2021-06-23 DIAGNOSIS — G8929 Other chronic pain: Secondary | ICD-10-CM

## 2021-06-23 DIAGNOSIS — Z885 Allergy status to narcotic agent status: Secondary | ICD-10-CM | POA: Diagnosis not present

## 2021-06-23 DIAGNOSIS — M25461 Effusion, right knee: Secondary | ICD-10-CM

## 2021-06-23 DIAGNOSIS — R9431 Abnormal electrocardiogram [ECG] [EKG]: Secondary | ICD-10-CM

## 2021-06-23 DIAGNOSIS — Z79899 Other long term (current) drug therapy: Secondary | ICD-10-CM | POA: Diagnosis not present

## 2021-06-23 DIAGNOSIS — R778 Other specified abnormalities of plasma proteins: Secondary | ICD-10-CM | POA: Diagnosis present

## 2021-06-23 DIAGNOSIS — A419 Sepsis, unspecified organism: Secondary | ICD-10-CM | POA: Diagnosis present

## 2021-06-23 NOTE — ED Triage Notes (Addendum)
Pt c/o generalized weakness for the past 2 weeks. Pt seen by PCP and given prescription for allergy medication, steroids and cough medication. Pt states he was dx with bronchitis. ?

## 2021-06-24 ENCOUNTER — Inpatient Hospital Stay (HOSPITAL_COMMUNITY): Payer: Medicare Other

## 2021-06-24 ENCOUNTER — Emergency Department (HOSPITAL_COMMUNITY): Payer: Medicare Other

## 2021-06-24 DIAGNOSIS — Z885 Allergy status to narcotic agent status: Secondary | ICD-10-CM | POA: Diagnosis not present

## 2021-06-24 DIAGNOSIS — Z79890 Hormone replacement therapy (postmenopausal): Secondary | ICD-10-CM | POA: Diagnosis not present

## 2021-06-24 DIAGNOSIS — N179 Acute kidney failure, unspecified: Secondary | ICD-10-CM | POA: Diagnosis present

## 2021-06-24 DIAGNOSIS — I248 Other forms of acute ischemic heart disease: Secondary | ICD-10-CM | POA: Diagnosis not present

## 2021-06-24 DIAGNOSIS — R778 Other specified abnormalities of plasma proteins: Secondary | ICD-10-CM | POA: Diagnosis not present

## 2021-06-24 DIAGNOSIS — R9431 Abnormal electrocardiogram [ECG] [EKG]: Secondary | ICD-10-CM | POA: Diagnosis not present

## 2021-06-24 DIAGNOSIS — J189 Pneumonia, unspecified organism: Secondary | ICD-10-CM | POA: Diagnosis not present

## 2021-06-24 DIAGNOSIS — M1710 Unilateral primary osteoarthritis, unspecified knee: Secondary | ICD-10-CM

## 2021-06-24 DIAGNOSIS — I129 Hypertensive chronic kidney disease with stage 1 through stage 4 chronic kidney disease, or unspecified chronic kidney disease: Secondary | ICD-10-CM | POA: Diagnosis not present

## 2021-06-24 DIAGNOSIS — A419 Sepsis, unspecified organism: Secondary | ICD-10-CM | POA: Diagnosis present

## 2021-06-24 DIAGNOSIS — D631 Anemia in chronic kidney disease: Secondary | ICD-10-CM | POA: Diagnosis not present

## 2021-06-24 DIAGNOSIS — R6520 Severe sepsis without septic shock: Secondary | ICD-10-CM | POA: Diagnosis not present

## 2021-06-24 DIAGNOSIS — I441 Atrioventricular block, second degree: Secondary | ICD-10-CM | POA: Diagnosis not present

## 2021-06-24 DIAGNOSIS — Z79899 Other long term (current) drug therapy: Secondary | ICD-10-CM | POA: Diagnosis not present

## 2021-06-24 DIAGNOSIS — D696 Thrombocytopenia, unspecified: Secondary | ICD-10-CM | POA: Diagnosis not present

## 2021-06-24 DIAGNOSIS — E785 Hyperlipidemia, unspecified: Secondary | ICD-10-CM | POA: Diagnosis not present

## 2021-06-24 DIAGNOSIS — J209 Acute bronchitis, unspecified: Secondary | ICD-10-CM | POA: Diagnosis not present

## 2021-06-24 DIAGNOSIS — R6510 Systemic inflammatory response syndrome (SIRS) of non-infectious origin without acute organ dysfunction: Secondary | ICD-10-CM

## 2021-06-24 DIAGNOSIS — D649 Anemia, unspecified: Secondary | ICD-10-CM | POA: Diagnosis present

## 2021-06-24 DIAGNOSIS — J208 Acute bronchitis due to other specified organisms: Secondary | ICD-10-CM | POA: Diagnosis not present

## 2021-06-24 DIAGNOSIS — N184 Chronic kidney disease, stage 4 (severe): Secondary | ICD-10-CM | POA: Diagnosis not present

## 2021-06-24 DIAGNOSIS — E875 Hyperkalemia: Secondary | ICD-10-CM | POA: Diagnosis not present

## 2021-06-24 LAB — CBC WITH DIFFERENTIAL/PLATELET
Abs Immature Granulocytes: 0.02 10*3/uL (ref 0.00–0.07)
Abs Immature Granulocytes: 0.02 10*3/uL (ref 0.00–0.07)
Basophils Absolute: 0 10*3/uL (ref 0.0–0.1)
Basophils Absolute: 0.1 10*3/uL (ref 0.0–0.1)
Basophils Relative: 1 %
Basophils Relative: 1 %
Eosinophils Absolute: 0 10*3/uL (ref 0.0–0.5)
Eosinophils Absolute: 0.1 10*3/uL (ref 0.0–0.5)
Eosinophils Relative: 1 %
Eosinophils Relative: 1 %
HCT: 23.1 % — ABNORMAL LOW (ref 39.0–52.0)
HCT: 27.4 % — ABNORMAL LOW (ref 39.0–52.0)
Hemoglobin: 7.8 g/dL — ABNORMAL LOW (ref 13.0–17.0)
Hemoglobin: 9.2 g/dL — ABNORMAL LOW (ref 13.0–17.0)
Immature Granulocytes: 0 %
Immature Granulocytes: 0 %
Lymphocytes Relative: 61 %
Lymphocytes Relative: 64 %
Lymphs Abs: 3.9 10*3/uL (ref 0.7–4.0)
Lymphs Abs: 5 10*3/uL — ABNORMAL HIGH (ref 0.7–4.0)
MCH: 28.3 pg (ref 26.0–34.0)
MCH: 29.1 pg (ref 26.0–34.0)
MCHC: 33.6 g/dL (ref 30.0–36.0)
MCHC: 33.8 g/dL (ref 30.0–36.0)
MCV: 84.3 fL (ref 80.0–100.0)
MCV: 86.2 fL (ref 80.0–100.0)
Monocytes Absolute: 0.3 10*3/uL (ref 0.1–1.0)
Monocytes Absolute: 0.4 10*3/uL (ref 0.1–1.0)
Monocytes Relative: 5 %
Monocytes Relative: 5 %
Neutro Abs: 1.8 10*3/uL (ref 1.7–7.7)
Neutro Abs: 2.6 10*3/uL (ref 1.7–7.7)
Neutrophils Relative %: 29 %
Neutrophils Relative %: 32 %
Platelets: 135 10*3/uL — ABNORMAL LOW (ref 150–400)
Platelets: 149 10*3/uL — ABNORMAL LOW (ref 150–400)
RBC: 2.68 MIL/uL — ABNORMAL LOW (ref 4.22–5.81)
RBC: 3.25 MIL/uL — ABNORMAL LOW (ref 4.22–5.81)
RDW: 13.5 % (ref 11.5–15.5)
RDW: 13.7 % (ref 11.5–15.5)
WBC Morphology: ABNORMAL
WBC: 6.1 10*3/uL (ref 4.0–10.5)
WBC: 7.9 10*3/uL (ref 4.0–10.5)
nRBC: 0 % (ref 0.0–0.2)
nRBC: 0 % (ref 0.0–0.2)

## 2021-06-24 LAB — URINALYSIS, ROUTINE W REFLEX MICROSCOPIC
Bacteria, UA: NONE SEEN
Bilirubin Urine: NEGATIVE
Glucose, UA: NEGATIVE mg/dL
Hgb urine dipstick: NEGATIVE
Ketones, ur: NEGATIVE mg/dL
Leukocytes,Ua: NEGATIVE
Nitrite: NEGATIVE
Protein, ur: 30 mg/dL — AB
Specific Gravity, Urine: 1.011 (ref 1.005–1.030)
pH: 6 (ref 5.0–8.0)

## 2021-06-24 LAB — COMPREHENSIVE METABOLIC PANEL
ALT: 49 U/L — ABNORMAL HIGH (ref 0–44)
ALT: 60 U/L — ABNORMAL HIGH (ref 0–44)
AST: 35 U/L (ref 15–41)
AST: 42 U/L — ABNORMAL HIGH (ref 15–41)
Albumin: 2.6 g/dL — ABNORMAL LOW (ref 3.5–5.0)
Albumin: 3.2 g/dL — ABNORMAL LOW (ref 3.5–5.0)
Alkaline Phosphatase: 123 U/L (ref 38–126)
Alkaline Phosphatase: 97 U/L (ref 38–126)
Anion gap: 10 (ref 5–15)
Anion gap: 7 (ref 5–15)
BUN: 44 mg/dL — ABNORMAL HIGH (ref 8–23)
BUN: 44 mg/dL — ABNORMAL HIGH (ref 8–23)
CO2: 17 mmol/L — ABNORMAL LOW (ref 22–32)
CO2: 20 mmol/L — ABNORMAL LOW (ref 22–32)
Calcium: 7.8 mg/dL — ABNORMAL LOW (ref 8.9–10.3)
Calcium: 8.2 mg/dL — ABNORMAL LOW (ref 8.9–10.3)
Chloride: 110 mmol/L (ref 98–111)
Chloride: 110 mmol/L (ref 98–111)
Creatinine, Ser: 4.03 mg/dL — ABNORMAL HIGH (ref 0.61–1.24)
Creatinine, Ser: 4.25 mg/dL — ABNORMAL HIGH (ref 0.61–1.24)
GFR, Estimated: 14 mL/min — ABNORMAL LOW (ref >60)
GFR, Estimated: 15 mL/min — ABNORMAL LOW (ref >60)
Glucose, Bld: 116 mg/dL — ABNORMAL HIGH (ref 70–99)
Glucose, Bld: 119 mg/dL — ABNORMAL HIGH (ref 70–99)
Potassium: 4.2 mmol/L (ref 3.5–5.1)
Potassium: 4.7 mmol/L (ref 3.5–5.1)
Sodium: 137 mmol/L (ref 135–145)
Sodium: 137 mmol/L (ref 135–145)
Total Bilirubin: 0.3 mg/dL (ref 0.3–1.2)
Total Bilirubin: 1 mg/dL (ref 0.3–1.2)
Total Protein: 5.6 g/dL — ABNORMAL LOW (ref 6.5–8.1)
Total Protein: 6.6 g/dL (ref 6.5–8.1)

## 2021-06-24 LAB — TROPONIN I (HIGH SENSITIVITY)
Troponin I (High Sensitivity): 30 ng/L — ABNORMAL HIGH (ref <18)
Troponin I (High Sensitivity): 34 ng/L — ABNORMAL HIGH (ref <18)

## 2021-06-24 LAB — LACTIC ACID, PLASMA
Lactic Acid, Venous: 2 mmol/L (ref 0.5–1.9)
Lactic Acid, Venous: 1.6 mmol/L (ref 0.5–1.9)

## 2021-06-24 LAB — TYPE AND SCREEN
ABO/RH(D): A POS
Antibody Screen: NEGATIVE

## 2021-06-24 LAB — MAGNESIUM: Magnesium: 1.8 mg/dL (ref 1.7–2.4)

## 2021-06-24 LAB — CORTISOL-AM, BLOOD: Cortisol - AM: 7.9 ug/dL (ref 6.7–22.6)

## 2021-06-24 LAB — STREP PNEUMONIAE URINARY ANTIGEN: Strep Pneumo Urinary Antigen: NEGATIVE

## 2021-06-24 LAB — HIV ANTIBODY (ROUTINE TESTING W REFLEX): HIV Screen 4th Generation wRfx: NONREACTIVE

## 2021-06-24 LAB — PROCALCITONIN: Procalcitonin: <0.1 ng/mL

## 2021-06-24 LAB — POC OCCULT BLOOD, ED: Fecal Occult Bld: NEGATIVE

## 2021-06-24 LAB — TSH: TSH: 5.522 u[IU]/mL — ABNORMAL HIGH (ref 0.350–4.500)

## 2021-06-24 LAB — PROTIME-INR
INR: 1.3 — ABNORMAL HIGH (ref 0.8–1.2)
Prothrombin Time: 15.6 seconds — ABNORMAL HIGH (ref 11.4–15.2)

## 2021-06-24 LAB — ABO/RH: ABO/RH(D): A POS

## 2021-06-24 LAB — BRAIN NATRIURETIC PEPTIDE: B Natriuretic Peptide: 110 pg/mL — ABNORMAL HIGH (ref 0.0–100.0)

## 2021-06-24 MED ORDER — IPRATROPIUM-ALBUTEROL 0.5-2.5 (3) MG/3ML IN SOLN: 3.0000 mL | Freq: Four times a day (QID) | RESPIRATORY_TRACT | Status: DC | PRN

## 2021-06-24 MED ORDER — ACETAMINOPHEN 500 MG PO TABS
1000.0000 mg | ORAL_TABLET | Freq: Once | ORAL | Status: AC
Start: 2021-06-24 — End: 2021-06-24
  Administered 2021-06-24: 1000 mg via ORAL
  Filled 2021-06-24: qty 2

## 2021-06-24 MED ORDER — LACTATED RINGERS IV BOLUS
1000.0000 mL | Freq: Once | INTRAVENOUS | Status: AC
Start: 2021-06-24 — End: 2021-06-24
  Administered 2021-06-24: 1000 mL via INTRAVENOUS

## 2021-06-24 MED ORDER — SODIUM CHLORIDE 0.9 % IV SOLN
500.0000 mg | INTRAVENOUS | Status: DC
  Administered 2021-06-24 – 2021-06-27 (×4): 500 mg via INTRAVENOUS
  Filled 2021-06-24 (×4): qty 5

## 2021-06-24 MED ORDER — ONDANSETRON HCL 4 MG PO TABS
4.0000 mg | ORAL_TABLET | Freq: Four times a day (QID) | ORAL | Status: DC | PRN
Start: 2021-06-24 — End: 2021-06-27

## 2021-06-24 MED ORDER — ACETAMINOPHEN 325 MG PO TABS
650.0000 mg | ORAL_TABLET | Freq: Four times a day (QID) | ORAL | Status: DC | PRN
  Administered 2021-06-25 (×2): 650 mg via ORAL
  Filled 2021-06-24 (×2): qty 2

## 2021-06-24 MED ORDER — OXYCODONE HCL 5 MG PO TABS: 5.0000 mg | ORAL_TABLET | ORAL | Status: DC | PRN

## 2021-06-24 MED ORDER — SODIUM CHLORIDE 0.9 % IV SOLN
2.0000 g | INTRAVENOUS | Status: DC
  Administered 2021-06-25 – 2021-06-27 (×3): 2 g via INTRAVENOUS
  Filled 2021-06-24 (×3): qty 20

## 2021-06-24 MED ORDER — BENZONATATE 100 MG PO CAPS
100.0000 mg | ORAL_CAPSULE | Freq: Once | ORAL | Status: AC
  Administered 2021-06-24: 100 mg via ORAL
  Filled 2021-06-24: qty 1

## 2021-06-24 MED ORDER — PRAVASTATIN SODIUM 40 MG PO TABS
40.0000 mg | ORAL_TABLET | Freq: Every day | ORAL | Status: DC
  Administered 2021-06-24 – 2021-06-27 (×4): 40 mg via ORAL
  Filled 2021-06-24 (×4): qty 1

## 2021-06-24 MED ORDER — DIPHENHYDRAMINE HCL 50 MG/ML IJ SOLN: 25.0000 mg | Freq: Four times a day (QID) | INTRAMUSCULAR | Status: DC | PRN

## 2021-06-24 MED ORDER — TAMSULOSIN HCL 0.4 MG PO CAPS
0.4000 mg | ORAL_CAPSULE | Freq: Every day | ORAL | Status: DC
  Administered 2021-06-24: 0.4 mg via ORAL
  Filled 2021-06-24: qty 1

## 2021-06-24 MED ORDER — ONDANSETRON HCL 4 MG/2ML IJ SOLN
4.0000 mg | Freq: Four times a day (QID) | INTRAMUSCULAR | Status: DC | PRN
Start: 2021-06-24 — End: 2021-06-27

## 2021-06-24 MED ORDER — HYDROCODONE BIT-HOMATROP MBR 5-1.5 MG/5ML PO SOLN
5.0000 mL | Freq: Four times a day (QID) | ORAL | Status: DC | PRN
  Administered 2021-06-24 – 2021-06-27 (×12): 5 mL via ORAL
  Filled 2021-06-24 (×12): qty 5

## 2021-06-24 MED ORDER — ALBUTEROL SULFATE (2.5 MG/3ML) 0.083% IN NEBU: 2.5000 mg | INHALATION_SOLUTION | RESPIRATORY_TRACT | Status: DC | PRN

## 2021-06-24 MED ORDER — SODIUM CHLORIDE 0.9 % IV SOLN
2.0000 g | Freq: Once | INTRAVENOUS | Status: AC
  Administered 2021-06-24: 2 g via INTRAVENOUS
  Filled 2021-06-24: qty 20

## 2021-06-24 MED ORDER — ACETAMINOPHEN 650 MG RE SUPP: 650.0000 mg | Freq: Four times a day (QID) | RECTAL | Status: DC | PRN

## 2021-06-24 MED ORDER — SODIUM CHLORIDE 0.9 % IV SOLN: INTRAVENOUS | Status: DC

## 2021-06-24 MED ORDER — ALBUTEROL SULFATE HFA 108 (90 BASE) MCG/ACT IN AERS
2.0000 | INHALATION_SPRAY | Freq: Once | RESPIRATORY_TRACT | Status: AC
  Administered 2021-06-24: 2 via RESPIRATORY_TRACT
  Filled 2021-06-24: qty 6.7

## 2021-06-24 MED ORDER — HEPARIN SODIUM (PORCINE) 5000 UNIT/ML IJ SOLN
5000.0000 [IU] | Freq: Three times a day (TID) | INTRAMUSCULAR | Status: DC
  Administered 2021-06-24 – 2021-06-27 (×10): 5000 [IU] via SUBCUTANEOUS
  Filled 2021-06-24 (×10): qty 1

## 2021-06-24 NOTE — ED Provider Notes (Signed)
?[facility] ?Provider Note ? ? ?CSN: [id number] ?Arrival date & time: 06/23/21  2340 ? ?  ? ?History ? ?Chief Complaint  ?Patient presents with  ? Weakness  ? ? ?Scott Savage is a 66 y.o. male. ? ?66 year old male who presents the ER today secondary to generalized weakness and cough.  Patient states he was seen by his doctor couple weeks ago and diagnosed with bronchitis.  Start on antibiotics and multiple other medications.  Continually has a nonproductive cough.  Feels generally weak.  Cough is worse with exertion.  No orthopnea but does have worsening cough with laying flat.  No history of heart failure.  No edema.  No chest pain.  Denied fevers but is febrile here.  No focal weakness.  ? ? ?Weakness ? ?  ? ?Home Medications ?Prior to Admission medications   ?Medication Sig Start Date End Date Taking? Authorizing Provider  ?acetaminophen (TYLENOL) 500 MG tablet Take 1,000 mg by mouth every 6 (six) hours as needed for moderate pain or headache.    [provider]  ?cholecalciferol (VITAMIN D3) 25 MCG (1000 UNIT) tablet Take 1,000 Units by mouth daily.    [provider]  ?hydrochlorothiazide (HYDRODIURIL) 25 MG tablet Take 25 mg by mouth daily.    [provider]  ?losartan (COZAAR) 100 MG tablet Take 100 mg by mouth daily.  06/14/18   [provider]  ?meloxicam (MOBIC) 7.5 MG tablet Take 1 tablet (7.5 mg total) by mouth daily. 02/28/21   Carole Civil, MD  ?pravastatin (PRAVACHOL) 40 MG tablet Take 40 mg by mouth daily.    [provider]  ?Tamsulosin HCl (FLOMAX) 0.4 MG CAPS Take 0.4 mg by mouth at bedtime.     [provider]  ?testosterone cypionate (DEPOTESTOSTERONE CYPIONATE) 200 MG/ML injection Inject 200 mg into the muscle every 28 (twenty-eight) days.  08/28/18   [provider]  ?   ? ?Allergies    ?Hydrocodone   ? ?Review of Systems   ?Review of Systems  ?Neurological:  Positive for weakness.  ? ?Physical  Exam ?Updated Vital Signs ?BP 135/60 (BP Location: Left Arm)   Pulse 80   Temp (!) 100.5 ?F (38.1 ?C) (Oral)   Resp 20   Ht 6' (1.829 m)   Wt 102.1 kg   SpO2 100%   BMI 30.52 kg/m?  ?Physical Exam ?Vitals and nursing note reviewed.  ?Constitutional:   ?   Appearance: He is well-developed.  ?HENT:  ?   Head: Normocephalic and atraumatic.  ?   Mouth/Throat:  ?   Mouth: Mucous membranes are moist.  ?   Pharynx: Oropharynx is clear.  ?Eyes:  ?   Pupils: Pupils are equal, round, and reactive to light.  ?Cardiovascular:  ?   Rate and Rhythm: Normal rate.  ?Pulmonary:  ?   Effort: Pulmonary effort is normal. No respiratory distress.  ?Abdominal:  ?   General: Abdomen is flat. There is no distension.  ?Musculoskeletal:     ?   General: Normal range of motion.  ?   Cervical back: Normal range of motion.  ?Skin: ?   General: Skin is warm and dry.  ?Neurological:  ?   General: No focal deficit present.  ?   Mental Status: He is alert.  ? ? ?ED Results / Procedures / Treatments   ?Labs ?(all labs ordered are listed, but only abnormal results are displayed) ?Labs Reviewed  ?CBC WITH DIFFERENTIAL/PLATELET  ?COMPREHENSIVE METABOLIC PANEL  ?  BRAIN NATRIURETIC PEPTIDE  ?TROPONIN I (HIGH SENSITIVITY)  ? ? ?EKG ?None ? ?Radiology ?No results found. ? ?Procedures ?Procedures  ? ? ?Medications Ordered in ED ?Medications  ?albuterol (VENTOLIN HFA) 108 (90 Base) MCG/ACT inhaler 2 puff (has no administration in time range)  ?benzonatate (TESSALON) capsule 100 mg (has no administration in time range)  ?acetaminophen (TYLENOL) tablet 1,000 mg (has no administration in time range)  ? ? ?ED Course/ Medical Decision Making/ A&P ?Clinical Course as of 06/26/21 0225  ?Mon Jun 24, 2021  ?0215 EKG 12-Lead ?Appears like mobitz type I which would be new.  [JM]  ?0215 Troponin I (High Sensitivity)(!): 34 [JM]  ?0215 B Natriuretic Peptide(!): 110.0 [JM]  ?0215 Hemoglobin(!): 9.2 ?No h/o anemia. Does have h/o CKD, could be related to that.  Hemoccult negative.  [JM]  ?0215 CO2(!): 17 [JM]  ?0215 Creatinine(!): 4.25 ?Has h/o stage IV, unclear other numbers though.  [JM]  ?0216 BP(!): 93/49 ?Persistent soft pressures. Responding to fluids.  [JM]  ?0216 Temp(!): 100.6 ?F (38.1 ?C) ?Unclear etiology. Has respiratory symptoms, could be pneumonia? Will initiate antibiotics.  [JM]  ?  ?Clinical Course User Index ?[JM] Annie Saephan, Corene Cornea, MD  ? ?                        ?Medical Decision Making ?Amount and/or Complexity of Data Reviewed ?Labs: ordered. Decision-making details documented in ED Course. ?Radiology: ordered. ?ECG/medicine tests: ordered. Decision-making details documented in ED Course. ? ?Risk ?OTC drugs. ?Prescription drug management. ?Decision regarding hospitalization. ? ?Post bronchitic cough vs pneumonia. Less likely CHF but with exertional symptoms will check bnp/trop.  ?Labs as above. Worsened renal function. Infection. Low bicarb. New mobitz. Possibly myocarditis or pericarditis or other cardiac cause. Will admit for continued treatment and echo as deemed necessary.  ? ?Final Clinical Impression(s) / ED Diagnoses ?Final diagnoses:  ?Community acquired pneumonia, unspecified laterality  ?AKI (acute kidney injury) ([place])  ?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(Wenckebach's) atrioventricular block  ? ? ?Rx / DC Orders ?ED Discharge Orders   ? ? None  ? ?  ? ? ?  ?Merrily Pew, MD ?06/26/21 [phone] ? ?

## 2021-06-24 NOTE — Progress Notes (Signed)
Patient seen and examined; admitted after midnight secondary to increased shortness of breath, URI symptoms, chills, fatigue and generalized weakness.  Patient had met SIRS criteria on presentation but no ruling in for sepsis given lack of source identified currently.  Found with acute kidney injury (unclear if acute on chronic, no lab work for comparison x-ray for 10 years ago with already a creatinine of 1.9 around the time).  Patient has been complaining of dysuria for the last 2 weeks as present his overall symptoms has been continuously worsening for around the same time.  He is also now having decreasing his oral intake and appetite.  Has been compliant with his medication which includes hydrochlorothiazide, Cozaar, Mobic.  Please refer to H&P written by Dr. Clearence Ped for further info/details on admission. ? ?Plan: ?-He has been empirically started on antibiotics ?-Will follow blood cultures, sputum culture and also strep pneumo and Legionella antigen in urine. ?-Follow urine culture ?-Will check renal ultrasound ?-Continue aggressive fluid resuscitation and avoid nephrotoxic agents ?-Procalcitonin < 0.10; patient with low-grade temperature now. ?-Continue aggressive fluid resuscitation, supportive care and follow clinical response. ? ?Barton Dubois MD ?[phone] ? ?

## 2021-06-24 NOTE — Assessment & Plan Note (Addendum)
-   Anemia of chronic kidney disease ?-No signs of overt bleeding ?-Hemoglobin in the 8-9 range not requiring transfusion ?-Continue to follow hemoglobin trend. ?

## 2021-06-24 NOTE — ED Notes (Signed)
Report given to Brook RN 

## 2021-06-24 NOTE — Plan of Care (Signed)

## 2021-06-24 NOTE — Assessment & Plan Note (Addendum)
-  Initial troponin elevated 34>>30 ?-EKG not demonstrating acute ischemic changes; patient denies chest pain. ?-Continue telemetry monitoring. ?-due to demand ischemia ?

## 2021-06-24 NOTE — Assessment & Plan Note (Addendum)
-   EKG is irregular, changed from previous ?-Monitor on telemetry ?-Anticipate normalization with hydration ?-2 L bolus given in the ED ?-100 mL/h ?-Initial troponin minimally elevated at 34 in the setting of AKI ?-Repeat troponin ?-Continue to monitor ? ?

## 2021-06-24 NOTE — Assessment & Plan Note (Addendum)
-  Discussed with patient he reported that he had been following at [facility] and they have been told that he is currently on a stage IV ?-baseline creatinine 2.9-3.0 ?-d/c losartan and HCTZ ?-serum creatinine peaked 4.25 ?-renal US--no hydro; bilateral parenchymal thinning ?-serum creatinine 2.89 on day of d/c ?

## 2021-06-24 NOTE — TOC Progression Note (Signed)
Transition of Care (TOC) - Progression Note  ? ? ?Patient Details  ?Name: RANDEL HARGENS ?MRN: [id number] ?Date of Birth: 1955/06/04 ? ?Transition of Care (TOC) CM/SW Contact  ?Salome Arnt, LCSW ?Phone Number: ?06/24/2021, 1:44 PM ? ?Clinical Narrative:   ?Transition of Care (TOC) Screening Note ? ? ?Patient Details  ?Name: HADRIEL NORTHUP ?Date of Birth: October 06, 1955 ? ? ?Transition of Care (TOC) CM/SW Contact:    ?Salome Arnt, LCSW ?Phone Number: ?06/24/2021, 1:44 PM ? ? ? ?Transition of Care Department [facility]) has reviewed patient and no TOC needs have been identified at this time. We will continue to monitor patient advancement through interdisciplinary progression rounds. If new patient transition needs arise, please place a TOC consult. ?    ? ? ? ?  ?  ? ?Expected Discharge Plan and Services ?  ?  ?  ?  ?  ?                ?  ?  ?  ?  ?  ?  ?  ?  ?  ?  ? ? ?Social Determinants of Health (SDOH) Interventions ?  ? ?Readmission Risk Interventions ?   ? View : No data to display.  ?  ?  ?  ? ? ?

## 2021-06-24 NOTE — Assessment & Plan Note (Addendum)
-  Fever, cough present for about a week prior to admission. ?-Empirically started on Rocephin and Zithromax; which will be continued. ?-Chest x-ray negative ?-Follow Blood cultures and expectorated sputum cultures (pending currently). ?-Legionella and strep antigens negative. ?-Continue supportive care, adequate hydration, as needed bronchodilators and follow clinical response. ?

## 2021-06-24 NOTE — ED Notes (Signed)
Pt to radiology via stretcher by rad tech. 

## 2021-06-24 NOTE — Assessment & Plan Note (Addendum)
-  Patient met SIRS/sepsis criteria at time of admission with Temp 100.6, heart rate 114, respiratory rate 26, acute kidney injury ?-continue ceftriaxone and azithro ?-blood cultures--neg ?-lactic peaked 1.6 ?-check PCT ?-personally reviewed CXR--no infiltrates or edema ?-CT chest ?-viral resp panel ?-COVID--neg ? ?

## 2021-06-24 NOTE — H&P (Signed)
?History and Physical  ? ? ?Patient: Scott Savage IRC:[id number] DOB: 11-11-1955 ?DOA: 06/23/2021 ?DOS: the patient was seen and examined on 06/24/2021 ?PCP: Michell Heinrich, DO  ?Patient coming from: Home ? ?Chief Complaint:  ?Chief Complaint  ?Patient presents with  ? Weakness  ? ?HPI: Scott Savage is a 66 y.o. male with medical history significant of hypertension, hyperlipidemia presents to the ED with a chief complaint of cough and generalized weakness.  Patient reports that the symptoms started about 2 weeks ago.  He had a dry cough, but felt like he needed to bring sputum up and could not.  He became so fatigued.  He saw a provider who prescribed steroids, albuterol, Z-Pak.  Patient reports that he felt better while he was on that regimen, but immediately after the regimen ended he started having the same symptoms.  He denies any fevers, chest pain, dyspnea.  Reports muscle soreness from coughing, and neck soreness from coughing, fatigue, and generalized weakness.  Patient also reports he has had dysuria for 2 weeks.  Patient denies any peripheral edema or orthopnea.  Patient does not have any other complaints. ? ?He does not smoke, does not drink, does not use illicit drugs.  He is vaccinated for COVID.  Patient is full code. ?Review of Systems: As mentioned in the history of present illness. All other systems reviewed and are negative. ?Past Medical History:  ?Diagnosis Date  ? HTN (hypertension)   ? Hyperlipemia   ? PONV (postoperative nausea and vomiting)   ? Urinary complications   ? Uses flomax to help empty bladder  ? ?Past Surgical History:  ?Procedure Laterality Date  ? BIOPSY  08/01/2019  ? Procedure: BIOPSY;  Surgeon: Rogene Houston, MD;  Location: [facility];  Service: Endoscopy;;  ? COLONOSCOPY N/A 08/01/2019  ? Procedure: COLONOSCOPY;  Surgeon: Rogene Houston, MD;  Location: [facility];  Service: Endoscopy;  Laterality: N/A;  730  ? left foot  2011  ? [facility]  ? left  shoulder  2008  ? Short Stay in [place]  ? RESECTION DISTAL CLAVICAL  07/04/2011  ? Procedure: RESECTION DISTAL CLAVICAL;  Surgeon: Carole Civil, MD;  Location: [facility] ORS;  Service: Orthopedics;  Laterality: Right;  ? SHOULDER OPEN ROTATOR CUFF REPAIR  07/04/2011  ? Procedure: ROTATOR CUFF REPAIR SHOULDER OPEN;  Surgeon: Carole Civil, MD;  Location: [facility] ORS;  Service: Orthopedics;  Laterality: Right;  ? ?Social History:  reports that he has never smoked. He has never used smokeless tobacco. He reports that he does not drink alcohol and does not use drugs. ? ?Allergies  ?Allergen Reactions  ? Hydrocodone Itching and Nausea And Vomiting  ? ? ?Family History  ?Problem Relation Age of Onset  ? Cancer Mother   ? Cancer Father   ? Cancer Sister   ? Cancer Brother   ? Arthritis Other   ? Kidney disease Other   ? ? ?Prior to Admission medications   ?Medication Sig Start Date End Date Taking? Authorizing Provider  ?acetaminophen (TYLENOL) 500 MG tablet Take 1,000 mg by mouth every 6 (six) hours as needed for moderate pain or headache.    [provider]  ?cholecalciferol (VITAMIN D3) 25 MCG (1000 UNIT) tablet Take 1,000 Units by mouth daily.    [provider]  ?hydrochlorothiazide (HYDRODIURIL) 25 MG tablet Take 25 mg by mouth daily.    [provider]  ?losartan (COZAAR) 100 MG tablet Take 100 mg by  mouth daily.  06/14/18   [provider]  ?meloxicam (MOBIC) 7.5 MG tablet Take 1 tablet (7.5 mg total) by mouth daily. 02/28/21   Carole Civil, MD  ?pravastatin (PRAVACHOL) 40 MG tablet Take 40 mg by mouth daily.    [provider]  ?Tamsulosin HCl (FLOMAX) 0.4 MG CAPS Take 0.4 mg by mouth at bedtime.     [provider]  ?testosterone cypionate (DEPOTESTOSTERONE CYPIONATE) 200 MG/ML injection Inject 200 mg into the muscle every 28 (twenty-eight) days.  08/28/18   [provider]  ? ? ?Physical Exam: ?Vitals:  ? 06/24/21 0430 06/24/21 0445 06/24/21  0500 06/24/21 0515  ?BP: (!) 103/56 (!) 101/59 (!) 106/55 (!) 104/51  ?Pulse: 99 (!) 103 90 89  ?Resp: (!) '31 20 14 '$ (!) 33  ?Temp:      ?TempSrc:      ?SpO2: 94% 100% 97% 94%  ?Weight:      ?Height:      ? ?1.  General: ?Patient lying supine in bed,  no acute distress ?  ?2. Psychiatric: ?Alert and oriented x 3, mood and behavior normal for situation, pleasant and cooperative with exam ?  ?3. Neurologic: ?Speech and language are normal, face is symmetric, moves all 4 extremities voluntarily, at baseline without acute deficits on limited exam ?  ?4. HEENMT:  ?Head is atraumatic, normocephalic, pupils reactive to light, neck is supple, trachea is midline, mucous membranes are moist ?  ?5. Respiratory : ?Lungs are clear to auscultation bilaterally without wheezing, rhonchi, rales, no cyanosis, observed moderately violent dry coughing spell ?  ?6. Cardiovascular : ?Heart rate normal, rhythm is irregular, no murmurs, rubs or gallops, no peripheral edema, peripheral pulses palpated ?  ?7. Gastrointestinal:  ?Abdomen is soft, nondistended, nontender to palpation bowel sounds active, no masses or organomegaly palpated ?  ?8. Skin:  ?Skin is warm, dry and intact without rashes, acute lesions, or ulcers on limited exam ?  ?9.Musculoskeletal:  ?No acute deformities or trauma, no asymmetry in tone, no peripheral edema, peripheral pulses palpated, no tenderness to palpation in the extremities ? ?Data Reviewed: ?In the ED ?Temp 100.5-100.6, respiratory rate 80-114, respiratory rate 12-26, blood pressure 86/41-135/83, satting at 92% ?Blood cultures pending ?Given anemia FOBT was done that was negative ?Chest x-ray negative ?EKG shows a heart rate of 92, irregular rhythm, QTc 446 ?BNP 110, Trope 34 ?No leukocytosis with white blood cell count of 7.9, hemoglobin 9.2 ?Chemistry reveals an AKI with a BUN of 44 and a creatinine of 4.25 ?Patient was given Tylenol for the fever, albuterol, Zithromax, Rocephin, Tessalon Perles, 2 L bolus  in the ED ?Admission requested for further management of sepsis secondary to CAP ? ? ?Assessment and Plan: ?* Sepsis ([place]) ?Temp 100.6, heart rate 114, respiratory rate 26, blood pressure as low as 86/41, AKI ?-Given cough and fever, no evidence of another source, clinical diagnosis of pneumonia ?-Started on Rocephin and Zithromax ?-Blood cultures pending ?Expectorated sputum pending ?Continue to monitor ? ?Abnormal EKG ?- EKG is irregular, changed from previous ?-Monitor on telemetry ?-Anticipate normalization with hydration ?-2 L bolus given in the ED ?-100 mL/h ?-Initial troponin minimally elevated at 34 in the setting of AKI ?-Repeat troponin ?-Continue to monitor ? ? ?Anemia ?- No baseline labs to compare ?-Hemoglobin 9.2 ?-Anemia panel in the a.m. ?-No active signs of bleeding ?-Continue to monitor ? ?AKI (acute kidney injury) ([place]) ?- Creatinine 4.25 ?-No recent baseline labs to compare ?-In 2013 creatinine 1.9 ?-Avoid nephrotoxic  agents ?-Continue hydrating ?-Continue to monitor ? ?Elevated troponin ?- Initial troponin elevated 34 ?-EKG is changed from previous without definitive ischemic changes ?-Trend troponin ?-Monitor on telemetry ? ?CAP (community acquired pneumonia) ?- Fever, cough ?-Continue Rocephin and Zithromax ?-Chest x-ray negative ?-Blood cultures pending ?-Expectorated sputum ordered ?-Legionella and strep antigens ordered ?-Continue to monitor ? ? ? ? ? Advance Care Planning:   Code Status: Full Code  ? ?Consults: None ? ?Family Communication: Wife at bedside ? ?Severity of Illness: ?The appropriate patient status for this patient is INPATIENT. Inpatient status is judged to be reasonable and necessary in order to provide the required intensity of service to ensure the patient's safety. The patient's presenting symptoms, physical exam findings, and initial radiographic and laboratory data in the context of their chronic comorbidities is felt to place them at high risk for further clinical  deterioration. Furthermore, it is not anticipated that the patient will be medically stable for discharge from the hospital within 2 midnights of admission.  ? ?* I certify that at the point of admission it is m

## 2021-06-24 NOTE — Evaluation (Signed)
Physical Therapy Evaluation ?Patient Details ?Name: Scott Savage ?MRN: [id number] ?DOB: 11-26-1955 ?Today's Date: 06/24/2021 ? ?History of Present Illness ? JUANDIEGO KOLENOVIC is a 66 y.o. male with medical history significant of hypertension, hyperlipidemia presents to the ED with a chief complaint of cough and generalized weakness.  Patient reports that the symptoms started about 2 weeks ago.  He had a dry cough, but felt like he needed to bring sputum up and could not.  He became so fatigued.  He saw a provider who prescribed steroids, albuterol, Z-Pak.  Patient reports that he felt better while he was on that regimen, but immediately after the regimen ended he started having the same symptoms.  He denies any fevers, chest pain, dyspnea.  Reports muscle soreness from coughing, and neck soreness from coughing, fatigue, and generalized weakness.  Patient also reports he has had dysuria for 2 weeks.  Patient denies any peripheral edema or orthopnea.  Patient does not have any other complaints. ?  ?Clinical Impression ? Patient independent with all mobility but does demonstrate slight unsteadiness when ambulating in room and hall without AD.  Patient discharged to care of nursing for ambulation daily as tolerated for length of stay. ? ?   ? ?Recommendations for follow up therapy are one component of a multi-disciplinary discharge planning process, led by the attending physician.  Recommendations may be updated based on patient status, additional functional criteria and insurance authorization. ? ?Follow Up Recommendations No PT follow up ? ?  ?Assistance Recommended at Discharge PRN  ?Patient can return home with the following ?   ? ?  ?Equipment Recommendations None recommended by PT  ?Recommendations for Other Services ?    ?  ?Functional Status Assessment Patient has had a recent decline in their functional status and demonstrates the ability to make significant improvements in function in a reasonable and  predictable amount of time.  ? ?  ?Precautions / Restrictions Precautions ?Precautions: Fall ?Restrictions ?Weight Bearing Restrictions: No  ? ?  ? ?Mobility ? Bed Mobility ?Overal bed mobility: Independent ?  ?  ?  ?  ?  ?  ?  ?  ? ?Transfers ?Overall transfer level: Independent ?Equipment used: None ?  ?  ?  ?  ?  ?  ?  ?  ?  ? ?Ambulation/Gait ?Ambulation/Gait assistance: Independent ?Gait Distance (Feet): 40 Feet ?Assistive device: None ?Gait Pattern/deviations: Antalgic ?  ?  ?  ?General Gait Details: antalgic RLE ? ?Stairs ?  ?  ?  ?  ?  ? ?Wheelchair Mobility ?  ? ?Modified Rankin (Stroke Patients Only) ?  ? ?  ? ?Balance Overall balance assessment: Independent ?  ?  ?  ?  ?  ?  ?  ?  ?  ?  ?  ?  ?  ?  ?  ?  ?  ?  ?   ? ? ? ?Pertinent Vitals/Pain Pain Assessment ?Pain Assessment: No/denies pain  ? ? ?Home Living Family/patient expects to be discharged to:: Private residence ?Living Arrangements: Spouse/significant other ?Available Help at Discharge: Family ?Type of Home: House ?Home Access: Level entry ?  ?  ?  ?Home Layout: One level ?Home Equipment: None ?   ?  ?Prior Function Prior Level of Function : Independent/Modified Independent ?  ?  ?  ?  ?  ?  ?Mobility Comments: community ambulation without AD ?ADLs Comments: Independent without AD ?  ? ? ?Hand Dominance  ?   ? ?  ?  Extremity/Trunk Assessment  ? Upper Extremity Assessment ?Upper Extremity Assessment: Overall WFL for tasks assessed ?  ? ?Lower Extremity Assessment ?Lower Extremity Assessment: Overall WFL for tasks assessed ?  ? ?Cervical / Trunk Assessment ?Cervical / Trunk Assessment: Normal  ?Communication  ?    ?Cognition Arousal/Alertness: Awake/alert ?Behavior During Therapy: [facility] for tasks assessed/performed ?Overall Cognitive Status: Within Functional Limits for tasks assessed ?  ?  ?  ?  ?  ?  ?  ?  ?  ?  ?  ?  ?  ?  ?  ?  ?  ?  ?  ? ?  ?General Comments   ? ?  ?Exercises    ? ?Assessment/Plan  ?  ?PT Assessment Patient does not need any  further PT services  ?PT Problem List   ? ?   ?  ?PT Treatment Interventions     ? ?PT Goals (Current goals can be found in the Care Plan section)  ?Acute Rehab PT Goals ?Patient Stated Goal: Return home ?PT Goal Formulation: With patient ?Time For Goal Achievement: 06/24/21 ?Potential to Achieve Goals: Good ? ?  ?Frequency   ?  ? ? ?Co-evaluation   ?  ?  ?  ?  ? ? ?  ?AM-PAC PT "6 Clicks" Mobility  ?Outcome Measure Help needed turning from your back to your side while in a flat bed without using bedrails?: None ?Help needed moving from lying on your back to sitting on the side of a flat bed without using bedrails?: None ?Help needed moving to and from a bed to a chair (including a wheelchair)?: None ?Help needed standing up from a chair using your arms (e.g., wheelchair or bedside chair)?: None ?Help needed to walk in hospital room?: None ?Help needed climbing 3-5 steps with a railing? : None ?6 Click Score: 24 ? ?  ?End of Session   ?Activity Tolerance: Patient tolerated treatment well ?Patient left: in bed;with family/visitor present;with call bell/phone within reach ?Nurse Communication: Mobility status ?PT Visit Diagnosis: Unsteadiness on feet (R26.81);Other abnormalities of gait and mobility (R26.89) ?  ? ?Time: 1207-1214 ?PT Time Calculation (min) (ACUTE ONLY): 7 min ? ? ?Charges:   PT Evaluation ?$PT Eval Low Complexity: 1 Low ?  ?  ?   ? ?1:44 PM, 06/24/21 ?Mearl Latin PT, DPT ?Physical Therapist at [facility] ?[facility] ?' ? ?

## 2021-06-24 NOTE — ED Notes (Signed)
ED TO INPATIENT HANDOFF REPORT ? ?ED Nurse Name and Phone #:  Fabio Neighbors RN ? ?S ?Name/Age/Gender ?Scott Savage ?66 y.o. ?male ?Room/Bed: APA19/APA19 ? ?Code Status ?  Code Status: Full Code ? ?Home/SNF/Other ?Home ?Patient oriented to: self, place, time, and situation ?Is this baseline? Yes  ? ?Triage Complete: Triage complete  ?Chief Complaint ?Sepsis ([place]) [A41.9] ? ?Triage Note ?Pt c/o generalized weakness for the past 2 weeks. Pt seen by PCP and given prescription for allergy medication, steroids and cough medication. Pt states he was dx with bronchitis.  ? ?Allergies ?Allergies  ?Allergen Reactions  ? Hydrocodone Itching and Nausea And Vomiting  ? ? ?Level of Care/Admitting Diagnosis ?ED Disposition   ? ? ED Disposition  ?Admit  ? Condition  ?--  ? Comment  ?Hospital Area: [facility] [[id number]] ? Level of Care: Telemetry [5] ? Covid Evaluation: Asymptomatic - no recent exposure (last 10 days) testing not required ? Diagnosis: Sepsis ([place]) [2637858] ? Admitting Physician: Rolla Plate [[id number]] ? Attending Physician: Rolla Plate [[id number]] ? Estimated length of stay: past midnight tomorrow ? Certification:: I certify this patient will need inpatient services for at least 2 midnights ?  ?  ? ?  ? ? ?B ?Medical/Surgery History ?Past Medical History:  ?Diagnosis Date  ? HTN (hypertension)   ? Hyperlipemia   ? PONV (postoperative nausea and vomiting)   ? Urinary complications   ? Uses flomax to help empty bladder  ? ?Past Surgical History:  ?Procedure Laterality Date  ? BIOPSY  08/01/2019  ? Procedure: BIOPSY;  Surgeon: Rogene Houston, MD;  Location: [facility];  Service: Endoscopy;;  ? COLONOSCOPY N/A 08/01/2019  ? Procedure: COLONOSCOPY;  Surgeon: Rogene Houston, MD;  Location: [facility];  Service: Endoscopy;  Laterality: N/A;  730  ? left foot  2011  ? [facility]  ? left shoulder  2008  ? Short Stay in [place]  ? RESECTION DISTAL CLAVICAL  07/04/2011  ?  Procedure: RESECTION DISTAL CLAVICAL;  Surgeon: Carole Civil, MD;  Location: [facility] ORS;  Service: Orthopedics;  Laterality: Right;  ? SHOULDER OPEN ROTATOR CUFF REPAIR  07/04/2011  ? Procedure: ROTATOR CUFF REPAIR SHOULDER OPEN;  Surgeon: Carole Civil, MD;  Location: [facility] ORS;  Service: Orthopedics;  Laterality: Right;  ?  ? ?A ?IV Location/Drains/Wounds ?Patient Lines/Drains/Airways Status   ? ? Active Line/Drains/Airways   ? ? Name Placement date Placement time Site Days  ? Peripheral IV 06/24/21 20 G Right Antecubital 06/24/21  0020  Antecubital  less than 1  ? Peripheral IV 06/24/21 20 G Anterior;Right Forearm 06/24/21  0239  Forearm  less than 1  ? Incision 07/04/11 Shoulder Right 07/04/11  0957  -- 3643  ? ?  ?  ? ?  ? ? ?Intake/Output Last 24 hours ?No intake or output data in the 24 hours ending 06/24/21 0828 ? ?Labs/Imaging ?Results for orders placed or performed during the hospital encounter of 06/23/21 (from the past 48 hour(s))  ?CBC with Differential     Status: Abnormal  ? Collection Time: 06/24/21 12:15 AM  ?Result Value Ref Range  ? WBC 7.9 4.0 - 10.5 K/uL  ? RBC 3.25 (L) 4.22 - 5.81 MIL/uL  ? Hemoglobin 9.2 (L) 13.0 - 17.0 g/dL  ? HCT 27.4 (L) 39.0 - 52.0 %  ? MCV 84.3 80.0 - 100.0 fL  ? MCH 28.3 26.0 - 34.0 pg  ? MCHC 33.6 30.0 - 36.0  g/dL  ? RDW 13.5 11.5 - 15.5 %  ? Platelets 149 (L) 150 - 400 K/uL  ? nRBC 0.0 0.0 - 0.2 %  ? Neutrophils Relative % 32 %  ? Neutro Abs 2.6 1.7 - 7.7 K/uL  ? Lymphocytes Relative 61 %  ? Lymphs Abs 5.0 (H) 0.7 - 4.0 K/uL  ? Monocytes Relative 5 %  ? Monocytes Absolute 0.4 0.1 - 1.0 K/uL  ? Eosinophils Relative 1 %  ? Eosinophils Absolute 0.1 0.0 - 0.5 K/uL  ? Basophils Relative 1 %  ? Basophils Absolute 0.1 0.0 - 0.1 K/uL  ? WBC Morphology Abnormal lymphocytes present   ? Immature Granulocytes 0 %  ? Abs Immature Granulocytes 0.02 0.00 - 0.07 K/uL  ?  Comment: Performed at [facility], [address], [place], [place] [address]  ?Comprehensive metabolic panel      Status: Abnormal  ? Collection Time: 06/24/21 12:15 AM  ?Result Value Ref Range  ? Sodium 137 135 - 145 mmol/L  ? Potassium 4.7 3.5 - 5.1 mmol/L  ? Chloride 110 98 - 111 mmol/L  ? CO2 17 (L) 22 - 32 mmol/L  ? Glucose, Bld 116 (H) 70 - 99 mg/dL  ?  Comment: Glucose reference range applies only to samples taken after fasting for at least 8 hours.  ? BUN 44 (H) 8 - 23 mg/dL  ? Creatinine, Ser 4.25 (H) 0.61 - 1.24 mg/dL  ? Calcium 8.2 (L) 8.9 - 10.3 mg/dL  ? Total Protein 6.6 6.5 - 8.1 g/dL  ? Albumin 3.2 (L) 3.5 - 5.0 g/dL  ? AST 42 (H) 15 - 41 U/L  ? ALT 60 (H) 0 - 44 U/L  ? Alkaline Phosphatase 123 38 - 126 U/L  ? Total Bilirubin 1.0 0.3 - 1.2 mg/dL  ? GFR, Estimated 14 (L) >60 mL/min  ?  Comment: (NOTE) ?Calculated using the CKD-EPI Creatinine Equation (2021) ?  ? Anion gap 10 5 - 15  ?  Comment: Performed at [facility], [address] [place], [place] [address]  ?Brain natriuretic peptide     Status: Abnormal  ? Collection Time: 06/24/21 12:15 AM  ?Result Value Ref Range  ? B Natriuretic Peptide 110.0 (H) 0.0 - 100.0 pg/mL  ?  Comment: Performed at [facility], [address] [place], [place] [address]  ?Troponin I (High Sensitivity)     Status: Abnormal  ? Collection Time: 06/24/21 12:15 AM  ?Result Value Ref Range  ? Troponin I (High Sensitivity) 34 (H) <18 ng/L  ?  Comment: (NOTE) ?Elevated high sensitivity troponin I (hsTnI) values and significant  ?changes across serial measurements may suggest ACS but many other  ?chronic and acute conditions are known to elevate hsTnI results.  ?Refer to the "Links" section for chest pain algorithms and additional  ?guidance. ?Performed at [facility], [address], [place], [place] [address] ?  ?POC occult blood, ED     Status: None  ? Collection Time: 06/24/21  1:11 AM  ?Result Value Ref Range  ? Fecal Occult Bld NEGATIVE NEGATIVE  ?Urinalysis, Routine w reflex microscopic Urine, Clean Catch     Status: Abnormal  ? Collection Time: 06/24/21  2:30 AM  ?Result Value  Ref Range  ? Color, Urine YELLOW YELLOW  ? APPearance CLEAR CLEAR  ? Specific Gravity, Urine 1.011 1.005 - 1.030  ? pH 6.0 5.0 - 8.0  ? Glucose, UA NEGATIVE NEGATIVE mg/dL  ? Hgb urine dipstick NEGATIVE NEGATIVE  ? Bilirubin  Urine NEGATIVE NEGATIVE  ? Ketones, ur NEGATIVE NEGATIVE mg/dL  ? Protein, ur 30 (A) NEGATIVE mg/dL  ? Nitrite NEGATIVE NEGATIVE  ? Leukocytes,Ua NEGATIVE NEGATIVE  ? RBC / HPF 0-5 0 - 5 RBC/hpf  ? WBC, UA 0-5 0 - 5 WBC/hpf  ? Bacteria, UA NONE SEEN NONE SEEN  ?  Comment: Performed at [facility], [address], [place], [place] [address]  ?Lactic acid, plasma     Status: Abnormal  ? Collection Time: 06/24/21  2:34 AM  ?Result Value Ref Range  ? Lactic Acid, Venous 2.0 (HH) 0.5 - 1.9 mmol/L  ?  Comment: CRITICAL RESULT CALLED TO, READ BACK BY AND VERIFIED WITH: ?BLACK,C ON 06/24/21 @ 0326 BY ACOSTA,A  ?Performed at [facility], [address] [place], [place] [address] ?  ?Blood culture (routine x 2)     Status: None (Preliminary result)  ? Collection Time: 06/24/21  2:37 AM  ? Specimen: Left Antecubital; Blood  ?Result Value Ref Range  ? Specimen Description    ?  LEFT ANTECUBITAL BOTTLES DRAWN AEROBIC AND ANAEROBIC  ? Special Requests    ?  Blood Culture adequate volume ?Performed at [facility], [address], [place], [place] [address] ?  ? Culture PENDING   ? Report Status PENDING   ?Blood culture (routine x 2)     Status: None (Preliminary result)  ? Collection Time: 06/24/21  2:38 AM  ? Specimen: BLOOD RIGHT FOREARM  ?Result Value Ref Range  ? Specimen Description    ?  BLOOD RIGHT FOREARM BOTTLES DRAWN AEROBIC AND ANAEROBIC  ? Special Requests    ?  Blood Culture adequate volume ?Performed at [facility], [address], [place],  [address] ?  ? Culture PENDING   ? Report Status PENDING   ?Lactic acid, plasma     Status: None  ? Collection Time: 06/24/21  4:41 AM  ?Result Value Ref Range  ? Lactic Acid, Venous 1.6 0.5 - 1.9 mmol/L  ?  Comment: Performed at [facility],  [address], [place],  [address]  ?Troponin I (High Sensitivity)     Status: Abnormal  ? Collection Time: 06/24/21  4:41 AM  ?Result Value Ref Range  ? Troponin I (High Sensitivity) 30 (H) <18 ng/L  ?  [place]

## 2021-06-25 DIAGNOSIS — I248 Other forms of acute ischemic heart disease: Secondary | ICD-10-CM | POA: Diagnosis not present

## 2021-06-25 DIAGNOSIS — D696 Thrombocytopenia, unspecified: Secondary | ICD-10-CM | POA: Diagnosis not present

## 2021-06-25 DIAGNOSIS — J189 Pneumonia, unspecified organism: Secondary | ICD-10-CM | POA: Diagnosis not present

## 2021-06-25 DIAGNOSIS — D631 Anemia in chronic kidney disease: Secondary | ICD-10-CM

## 2021-06-25 DIAGNOSIS — N179 Acute kidney failure, unspecified: Secondary | ICD-10-CM | POA: Diagnosis not present

## 2021-06-25 DIAGNOSIS — N184 Chronic kidney disease, stage 4 (severe): Secondary | ICD-10-CM

## 2021-06-25 DIAGNOSIS — J209 Acute bronchitis, unspecified: Secondary | ICD-10-CM | POA: Diagnosis not present

## 2021-06-25 DIAGNOSIS — A419 Sepsis, unspecified organism: Secondary | ICD-10-CM | POA: Diagnosis not present

## 2021-06-25 LAB — BASIC METABOLIC PANEL
Anion gap: 6 (ref 5–15)
BUN: 32 mg/dL — ABNORMAL HIGH (ref 8–23)
CO2: 19 mmol/L — ABNORMAL LOW (ref 22–32)
Calcium: 7.5 mg/dL — ABNORMAL LOW (ref 8.9–10.3)
Chloride: 113 mmol/L — ABNORMAL HIGH (ref 98–111)
Creatinine, Ser: 3.55 mg/dL — ABNORMAL HIGH (ref 0.61–1.24)
GFR, Estimated: 18 mL/min — ABNORMAL LOW (ref >60)
Glucose, Bld: 100 mg/dL — ABNORMAL HIGH (ref 70–99)
Potassium: 4.8 mmol/L (ref 3.5–5.1)
Sodium: 138 mmol/L (ref 135–145)

## 2021-06-25 LAB — LEGIONELLA PNEUMOPHILA SEROGP 1 UR AG: L. pneumophila Serogp 1 Ur Ag: NEGATIVE

## 2021-06-25 LAB — IRON AND TIBC
Iron: 36 ug/dL — ABNORMAL LOW (ref 45–182)
Saturation Ratios: 19 % (ref 17.9–39.5)
TIBC: 193 ug/dL — ABNORMAL LOW (ref 250–450)
UIBC: 157 ug/dL

## 2021-06-25 LAB — RETICULOCYTES
Immature Retic Fract: 19.9 % — ABNORMAL HIGH (ref 2.3–15.9)
RBC.: 2.73 MIL/uL — ABNORMAL LOW (ref 4.22–5.81)
Retic Count, Absolute: 57.9 10*3/uL (ref 19.0–186.0)
Retic Ct Pct: 2.1 % (ref 0.4–3.1)

## 2021-06-25 LAB — FOLATE: Folate: 9.7 ng/mL (ref >5.9)

## 2021-06-25 LAB — VITAMIN B12: Vitamin B-12: 352 pg/mL (ref 180–914)

## 2021-06-25 LAB — FERRITIN: Ferritin: 233 ng/mL (ref 24–336)

## 2021-06-25 MED ORDER — TAMSULOSIN HCL 0.4 MG PO CAPS
0.8000 mg | ORAL_CAPSULE | Freq: Every day | ORAL | Status: DC
  Administered 2021-06-25 – 2021-06-26 (×2): 0.8 mg via ORAL
  Filled 2021-06-25 (×2): qty 2

## 2021-06-25 MED ORDER — SODIUM CHLORIDE 0.9 % IV SOLN: INTRAVENOUS | Status: AC

## 2021-06-25 NOTE — Progress Notes (Signed)
?   06/25/21 0217  ?Vitals  ?Temp (!) 100.4 ?F (38 ?C)  ?Temp Source Oral  ?BP (!) 109/43  ?MAP (mmHg) (!) 60  ?BP Method Automatic  ?Pulse Rate 69  ?Pulse Rate Source Monitor  ?Resp 17  ?MEWS COLOR  ?MEWS Score Color Green  ?Oxygen Therapy  ?SpO2 99 %  ?MEWS Score  ?MEWS Temp 0  ?MEWS Systolic 0  ?MEWS Pulse 0  ?MEWS RR 0  ?MEWS LOC 0  ?MEWS Score 0  ? ?MD Zierle-Ghosh made aware.  ?

## 2021-06-25 NOTE — Progress Notes (Signed)
?Progress Note ? ? ?Patient: Scott Savage QZE:[id number] DOB: 1955/11/14 DOA: 06/23/2021     1 ?DOS: the patient was seen and examined on 06/25/2021 ?  ?Brief hospital course: ?As per H&P written by Dr. Clearence Ped  on 06/24/2021 ?Scott Savage is a 66 y.o. male with medical history significant of hypertension, hyperlipidemia presents to the ED with a chief complaint of cough and generalized weakness.  Patient reports that the symptoms started about 2 weeks ago.  He had a dry cough, but felt like he needed to bring sputum up and could not.  He became so fatigued.  He saw a provider who prescribed steroids, albuterol, Z-Pak.  Patient reports that he felt better while he was on that regimen, but immediately after the regimen ended he started having the same symptoms.  He denies any fevers, chest pain, dyspnea.  Reports muscle soreness from coughing, and neck soreness from coughing, fatigue, and generalized weakness.  Patient also reports he has had dysuria for 2 weeks.  Patient denies any peripheral edema or orthopnea.  Patient does not have any other complaints. ?  ?He does not smoke, does not drink, does not use illicit drugs.  He is vaccinated for COVID.  Patient is full code. ? ?Assessment and Plan: ?* Sepsis ([place]) ?Temp 100.6, heart rate 114, respiratory rate 26, blood pressure as low as 86/41, AKI ?-Given cough and fever, no evidence of another source, clinical diagnosis of pneumonia ?-Started on Rocephin and Zithromax ?-Blood cultures pending ?Expectorated sputum pending ?Continue to monitor ? ?Abnormal EKG ?- EKG is irregular, changed from previous ?-Monitor on telemetry ?-Anticipate normalization with hydration ?-2 L bolus given in the ED ?-100 mL/h ?-Initial troponin minimally elevated at 34 in the setting of AKI ?-Repeat troponin ?-Continue to monitor ? ? ?Anemia ?- No baseline labs to compare ?-Hemoglobin 9.2 ?-Anemia panel in the a.m. ?-No active signs of bleeding ?-Continue to monitor ? ?AKI (acute  kidney injury) ([place]) ?- Creatinine 4.25 ?-No recent baseline labs to compare ?-In 2013 creatinine 1.9 ?-Avoid nephrotoxic agents ?-Continue hydrating ?-Continue to monitor ? ?Elevated troponin ?- Initial troponin elevated 34 ?-EKG is changed from previous without definitive ischemic changes ?-Trend troponin ?-Monitor on telemetry ? ?CAP (community acquired pneumonia) ?- Fever, cough ?-Continue Rocephin and Zithromax ?-Chest x-ray negative ?-Blood cultures pending ?-Expectorated sputum ordered ?-Legionella and strep antigens ordered ?-Continue to monitor ? ? ?Subjective:  ?Reporting dysuria, low-grade temperature overnight; no chest pain, no nausea or vomiting.  Expressed intermittent nonproductive coughing spells but denies shortness of breath. ? ?Physical Exam: ?Vitals:  ? 06/25/21 0521 06/25/21 0826 06/25/21 1231 06/25/21 1755  ?BP: 138/76 121/62 124/65 (!) 153/70  ?Pulse: 86 81 100 95  ?Resp: '20 18 18 16  '$ ?Temp: 98.3 ?F (36.8 ?C) 98.5 ?F (36.9 ?C) 98.8 ?F (37.1 ?C) 98.5 ?F (36.9 ?C)  ?TempSrc: Oral Oral Oral   ?SpO2: 100% 100% 100% 100%  ?Weight:      ?Height:      ? ?General exam: Alert, awake, oriented x 3; right temperature appreciated overnight.  Reports improvement in his dysuria symptoms.  Still having intermittent nonproductive cough.  No nausea, no vomiting, no chest pain. ?Respiratory system: Good air movement bilaterally; positive rhonchi, no wheezing, no crackles, no using accessory muscles.  Good saturation on room air. ?Cardiovascular system: Rate controlled, no rubs, no gallops, no JVD. ?Gastrointestinal system: Abdomen is nondistended, soft and nontender. No organomegaly or masses felt. Normal bowel sounds heard. ?Central nervous system: Alert and oriented. No  focal neurological deficits. ?Extremities: No cyanosis or clubbing. ?Skin: No petechiae. ?Psychiatry: Judgement and insight appear normal. Mood & affect appropriate.  ? ?Data Reviewed: ?B12 352 ?Procalcitonin <0.10 ?Basic metabolic panel  demonstrating a sodium of 138, potassium 4.8, bicarb 19, BUN 32 and creatinine 3.5 ?Renal ultrasound suggesting chronic morphologic changes in his kidneys and also demonstrating enlarged prostate with increased bladder wall thickening and concerns for bladder outlet obstruction. ? ?Family Communication: Daughter and wife at bedside. ? ?Disposition: ?Status is: Inpatient ?Remains inpatient appropriate because: Requiring IV antibiotics and IV fluids.  Renal function is still improving. ? ? Planned Discharge Destination: Home ? ?Barton Dubois, MD ?06/25/2021 6:03 PM ? ?For on call review [email or web address].  ?

## 2021-06-26 ENCOUNTER — Inpatient Hospital Stay (HOSPITAL_COMMUNITY): Payer: Medicare Other

## 2021-06-26 DIAGNOSIS — R778 Other specified abnormalities of plasma proteins: Secondary | ICD-10-CM

## 2021-06-26 DIAGNOSIS — N179 Acute kidney failure, unspecified: Secondary | ICD-10-CM | POA: Diagnosis not present

## 2021-06-26 DIAGNOSIS — I441 Atrioventricular block, second degree: Secondary | ICD-10-CM

## 2021-06-26 DIAGNOSIS — R6510 Systemic inflammatory response syndrome (SIRS) of non-infectious origin without acute organ dysfunction: Secondary | ICD-10-CM | POA: Diagnosis not present

## 2021-06-26 DIAGNOSIS — D696 Thrombocytopenia, unspecified: Secondary | ICD-10-CM

## 2021-06-26 DIAGNOSIS — J209 Acute bronchitis, unspecified: Secondary | ICD-10-CM | POA: Diagnosis not present

## 2021-06-26 DIAGNOSIS — J189 Pneumonia, unspecified organism: Secondary | ICD-10-CM | POA: Diagnosis not present

## 2021-06-26 DIAGNOSIS — A419 Sepsis, unspecified organism: Secondary | ICD-10-CM | POA: Diagnosis not present

## 2021-06-26 LAB — RESPIRATORY PANEL BY PCR

## 2021-06-26 LAB — BASIC METABOLIC PANEL
Anion gap: 7 (ref 5–15)
BUN: 25 mg/dL — ABNORMAL HIGH (ref 8–23)
CO2: 20 mmol/L — ABNORMAL LOW (ref 22–32)
Calcium: 7.9 mg/dL — ABNORMAL LOW (ref 8.9–10.3)
Chloride: 114 mmol/L — ABNORMAL HIGH (ref 98–111)
Creatinine, Ser: 3.11 mg/dL — ABNORMAL HIGH (ref 0.61–1.24)
GFR, Estimated: 21 mL/min — ABNORMAL LOW (ref >60)
Glucose, Bld: 94 mg/dL (ref 70–99)
Potassium: 4.8 mmol/L (ref 3.5–5.1)
Sodium: 141 mmol/L (ref 135–145)

## 2021-06-26 LAB — CBC
HCT: 24.2 % — ABNORMAL LOW (ref 39.0–52.0)
Hemoglobin: 7.9 g/dL — ABNORMAL LOW (ref 13.0–17.0)
MCH: 29.2 pg (ref 26.0–34.0)
MCHC: 32.6 g/dL (ref 30.0–36.0)
MCV: 89.3 fL (ref 80.0–100.0)
Platelets: 143 10*3/uL — ABNORMAL LOW (ref 150–400)
RBC: 2.71 MIL/uL — ABNORMAL LOW (ref 4.22–5.81)
RDW: 13.8 % (ref 11.5–15.5)
WBC: 5.5 10*3/uL (ref 4.0–10.5)
nRBC: 0 % (ref 0.0–0.2)

## 2021-06-26 MED ORDER — SODIUM CHLORIDE 0.9 % IV SOLN
INTRAVENOUS | Status: DC
Start: 2021-06-26 — End: 2021-06-27

## 2021-06-26 NOTE — Assessment & Plan Note (Signed)
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  He does not remember what his baseline creatinine. ?-unclear renal baseline ?-d/c losartan and HCTZ ?

## 2021-06-26 NOTE — Assessment & Plan Note (Addendum)
-  Patient met SIRS criteria at time of admission with Temp 100.6, heart rate 114, respiratory rate 26, acute kidney injury ?-continue ceftriaxone and azithro ?-blood cultures--neg ?-lactic peaked 1.6 ?-check PCT ?-personally reviewed CXR--no infiltrates or edema ?-CT chest--no infiltrates or effusion or edema ?-viral resp panel--+coronavirus NL63 ?-COVID--neg ?

## 2021-06-26 NOTE — Hospital Course (Signed)
History per Dr. Clearence Ped ? ?66 y.o. male with medical history significant of hypertension, hyperlipidemia presents to the ED with a chief complaint of cough and generalized weakness.  Patient reports that the symptoms started about 2 weeks ago.  He had a dry cough, but felt like he needed to bring sputum up and could not.  He became so fatigued.  He saw his PCP in [place] who prescribed steroids, albuterol, Z-Pak.  Patient reports that he felt better while he was on that regimen, but immediately after the regimen ended he started having the same symptoms.  He denies any fevers, chest pain, dyspnea.  Reports muscle soreness from coughing, and neck soreness from coughing, fatigue, and generalized weakness.  Patient also reports he has had dysuria for 2 weeks.  Patient denies any peripheral edema or orthopnea.  Patient does not have any other complaints. ?  ?He does not smoke, does not drink, does not use illicit drugs.  He is vaccinated for COVID.  Patient is full code. ?

## 2021-06-26 NOTE — Progress Notes (Signed)
?  ?       ?PROGRESS NOTE ? ?[name]:[id number] DOB: January 20, 1956 DOA: 06/23/2021 ?PCP: Michell Heinrich, DO ? ?Brief History:  ?History per Dr. Clearence Ped ? ?66 y.o. male with medical history significant of hypertension, hyperlipidemia presents to the ED with a chief complaint of cough and generalized weakness.  Patient reports that the symptoms started about 2 weeks ago.  He had a dry cough, but felt like he needed to bring sputum up and could not.  He became so fatigued.  He saw his PCP in [place] who prescribed steroids, albuterol, Z-Pak.  Patient reports that he felt better while he was on that regimen, but immediately after the regimen ended he started having the same symptoms.  He denies any fevers, chest pain, dyspnea.  Reports muscle soreness from coughing, and neck soreness from coughing, fatigue, and generalized weakness.  Patient also reports he has had dysuria for 2 weeks.  Patient denies any peripheral edema or orthopnea.  Patient does not have any other complaints. ?  ?He does not smoke, does not drink, does not use illicit drugs.  He is vaccinated for COVID.  Patient is full code.  ? ?Assessment/Plan: ? ? ?Principal Problem: ?  SIRS (systemic inflammatory response syndrome) (HCC) ?Active Problems: ?  CAP (community acquired pneumonia) ?  Acute renal failure superimposed on stage 4 chronic kidney disease ([place]) ?  Thrombocytopenia ([place]) ?  Elevated troponin ?  Anemia ? ?Assessment and Plan: ?* SIRS (systemic inflammatory response syndrome) (HCC) ?-Patient met SIRS criteria at time of admission with Temp 100.6, heart rate 114, respiratory rate 26, acute kidney injury ?-continue ceftriaxone and azithro ?-blood cultures--neg ?-lactic peaked 1.6 ?-check PCT ?-personally reviewed CXR--no infiltrates or edema ?-CT chest ?-viral resp panel ?-COVID--neg ? ?CAP (community acquired pneumonia) ?-Fever, cough present for about a week prior to admission. ?-Empirically started on Rocephin and Zithromax;  which will be continued. ?-Chest x-ray negative ?-Follow Blood cultures and expectorated sputum cultures (pending currently). ?-Legionella and strep antigens negative. ?-Continue supportive care, adequate hydration, as needed bronchodilators and follow clinical response. ? ?Acute renal failure superimposed on stage 4 chronic kidney disease ([place]) ?-Discussed with patient he reported that he had been following at [facility] and they have been told that he is currently on a stage IV.  He does not remember what his baseline creatinine. ?-unclear renal baseline ?-d/c losartan and HCTZ ?-serum creatinine peaked 4.25 ?-renal US--no hydro; bilateral parenchymal thinning ? ?Thrombocytopenia ([place]) ?B12--352 ?Check folate--9.7 ?TSH 5.522 ? ?Anemia ?- Anemia of chronic kidney disease ?-No signs of overt bleeding ?-Hemoglobin in the 8-9 range not requiring transfusion ?-Continue to follow hemoglobin trend. ? ?Elevated troponin ?-Initial troponin elevated 34>>30 ?-EKG not demonstrating acute ischemic changes; patient denies chest pain. ?-Continue telemetry monitoring. ?-due to demand ischemia ? ? ? ? ?Family Communication:   niece updated 4/19 ? ?Consultants:  none ? ?Code Status:  FULL ? ?DVT Prophylaxis:  [place] Heparin  ? ? ?Procedures: ?As Listed in Progress Note Above ? ?Antibiotics: ?Ceftriaxone 4/17>> ?Azithro 4/17> ? ? ? ? ? ?Subjective: ?Pt still has largely nonproductive cough, but it is a little better.  Denies n/v/d, abd pain, cp, hemoptysis. ? ?Objective: ?Vitals:  ? 06/25/21 1800 06/25/21 2111 06/26/21 0552 06/26/21 1407  ?BP:  (!) 151/73 (!) 110/50 140/80  ?Pulse:  (!) 101 88 (!) 45  ?Resp:  _0 ?Temp: 99.3 ?F (37.4 ?C) 99 ?F (37.2 ?C) 99.3 ?F (37.4 ?C)   ?TempSrc: Oral  Oral Oral   ?SpO2:  99% 99% 100%  ?Weight:      ?Height:      ? ? ?Intake/Output Summary (Last 24 hours) at 06/26/2021 1549 ?Last data filed at 06/26/2021 0900 ?Gross per 24 hour  ?Intake 1524.13 ml  ?Output --  ?Net 1524.13 ml  ? ?Weight change:   ?Exam: ? ?General:  Pt is alert, follows commands appropriately, not in acute distress ?HEENT: No icterus, No thrush, No neck mass, [place]/AT ?Cardiovascular: RRR, S1/S2, no rubs, no gallops ?Respiratory: R-base rales.  L-diminished;  no wheeze ?Abdomen: Soft/+BS, non tender, non distended, no guarding ?Extremities: No edema, No lymphangitis, No petechiae, No rashes, no synovitis ? ? ?Data Reviewed: ?I have personally reviewed following labs and imaging studies ?Basic Metabolic Panel: ?Recent Labs  ?Lab 06/24/21 ?0015 06/24/21 ?9937 06/25/21 ?1696 06/26/21 ?0432  ?NA 137 137 138 141  ?K 4.7 4.2 4.8 4.8  ?CL 110 110 113* 114*  ?CO2 17* 20* 19* 20*  ?GLUCOSE 116* 119* 100* 94  ?BUN 44* 44* 32* 25*  ?CREATININE 4.25* 4.03* 3.55* 3.11*  ?CALCIUM 8.2* 7.8* 7.5* 7.9*  ?MG  --  1.8  --   --   ? ?Liver Function Tests: ?Recent Labs  ?Lab 06/24/21 ?0015 06/24/21 ?7893  ?AST 42* 35  ?ALT 60* 49*  ?ALKPHOS 123 97  ?BILITOT 1.0 0.3  ?PROT 6.6 5.6*  ?ALBUMIN 3.2* 2.6*  ? ?No results for input(s): LIPASE, AMYLASE in the last 168 hours. ?No results for input(s): AMMONIA in the last 168 hours. ?Coagulation Profile: ?Recent Labs  ?Lab 06/24/21 ?8101  ?INR 1.3*  ? ?CBC: ?Recent Labs  ?Lab 06/24/21 ?0015 06/24/21 ?7510 06/26/21 ?2585  ?WBC 7.9 6.1 5.5  ?NEUTROABS 2.6 1.8  --   ?HGB 9.2* 7.8* 7.9*  ?HCT 27.4* 23.1* 24.2*  ?MCV 84.3 86.2 89.3  ?PLT 149* 135* 143*  ? ?Cardiac Enzymes: ?No results for input(s): CKTOTAL, CKMB, CKMBINDEX, TROPONINI in the last 168 hours. ?BNP: ?Invalid input(s): POCBNP ?CBG: ?No results for input(s): GLUCAP in the last 168 hours. ?HbA1C: ?No results for input(s): HGBA1C in the last 72 hours. ?Urine analysis: ?   ?Component Value Date/Time  ? COLORURINE YELLOW 06/24/2021 0230  ? APPEARANCEUR CLEAR 06/24/2021 0230  ? LABSPEC 1.011 06/24/2021 0230  ? PHURINE 6.0 06/24/2021 0230  ? GLUCOSEU NEGATIVE 06/24/2021 0230  ? [place] NEGATIVE 06/24/2021 0230  ? [place] NEGATIVE 06/24/2021 0230  ? [place] NEGATIVE  06/24/2021 0230  ? PROTEINUR 30 (A) 06/24/2021 0230  ? NITRITE NEGATIVE 06/24/2021 0230  ? LEUKOCYTESUR NEGATIVE 06/24/2021 0230  ? ?Sepsis Labs: ?_0 (procalcitonin:4,lacticidven:4) ?) ?Recent Results (from the past 240 hour(s))  ?Blood culture (routine x 2)     Status: None (Preliminary result)  ? Collection Time: 06/24/21  2:37 AM  ? Specimen: Left Antecubital; Blood  ?Result Value Ref Range Status  ? Specimen Description   Final  ?  LEFT ANTECUBITAL BOTTLES DRAWN AEROBIC AND ANAEROBIC  ? Special Requests Blood Culture adequate volume  Final  ? Culture   Final  ?  NO GROWTH 2 DAYS ?Performed at [facility], [address] [place], [place] [address] ?  ? Report Status PENDING  Incomplete  ?Blood culture (routine x 2)     Status: None (Preliminary result)  ? Collection Time: 06/24/21  2:38 AM  ? Specimen: BLOOD RIGHT FOREARM  ?Result Value Ref Range Status  ? Specimen Description   Final  ?  BLOOD RIGHT FOREARM BOTTLES DRAWN AEROBIC AND ANAEROBIC  ? Special  Requests Blood Culture adequate volume  Final  ? Culture   Final  ?  NO GROWTH 2 DAYS ?Performed at [facility], [address] [place], [place] [address] ?  ? Report Status PENDING  Incomplete  ?  ? ?Scheduled Meds: ? heparin  5,000 Units Subcutaneous Q8H  ? pravastatin  40 mg Oral Daily  ? tamsulosin  0.8 mg Oral QHS  ? ?Continuous Infusions: ? sodium chloride    ? azithromycin 500 mg (06/26/21 0130)  ? cefTRIAXone (ROCEPHIN)  IV 2 g (06/26/21 0333)  ? ? ?Procedures/Studies: ?DG Chest 2 View ? ?Result Date: 06/24/2021 ?CLINICAL DATA:  Pneumonia EXAM: CHEST - 2 VIEW COMPARISON:  None. FINDINGS: The heart size and mediastinal contours are within normal limits. Both lungs are clear. The visualized skeletal structures are unremarkable. IMPRESSION: No active cardiopulmonary disease. Electronically Signed   By: Fidela Salisbury M.D.   On: 06/24/2021 00:42  ? ?US RENAL ? ?Result Date: 06/24/2021 ?CLINICAL DATA:  Acute renal insufficiency. EXAM: RENAL / URINARY  TRACT ULTRASOUND COMPLETE COMPARISON:  None. FINDINGS: Right Kidney: Renal measurements: 9.1 x 4.8 x 6.3 cm = volume: 144 mL. Mild diffuse parenchymal thinning/atrophy. Echogenicity within normal limits. No mass

## 2021-06-26 NOTE — Assessment & Plan Note (Addendum)
B12--352 ?Check folate--9.7 ?TSH 5.522 ?--likely due to viral infection>>improving at time of dc ?

## 2021-06-27 DIAGNOSIS — J209 Acute bronchitis, unspecified: Secondary | ICD-10-CM

## 2021-06-27 DIAGNOSIS — A419 Sepsis, unspecified organism: Secondary | ICD-10-CM | POA: Diagnosis not present

## 2021-06-27 DIAGNOSIS — R6520 Severe sepsis without septic shock: Secondary | ICD-10-CM | POA: Diagnosis not present

## 2021-06-27 DIAGNOSIS — J208 Acute bronchitis due to other specified organisms: Secondary | ICD-10-CM

## 2021-06-27 DIAGNOSIS — E875 Hyperkalemia: Secondary | ICD-10-CM

## 2021-06-27 DIAGNOSIS — I441 Atrioventricular block, second degree: Secondary | ICD-10-CM | POA: Diagnosis not present

## 2021-06-27 DIAGNOSIS — N179 Acute kidney failure, unspecified: Secondary | ICD-10-CM | POA: Diagnosis not present

## 2021-06-27 DIAGNOSIS — D696 Thrombocytopenia, unspecified: Secondary | ICD-10-CM

## 2021-06-27 DIAGNOSIS — R6510 Systemic inflammatory response syndrome (SIRS) of non-infectious origin without acute organ dysfunction: Secondary | ICD-10-CM | POA: Diagnosis not present

## 2021-06-27 LAB — CBC
HCT: 23 % — ABNORMAL LOW (ref 39.0–52.0)
Hemoglobin: 7.5 g/dL — ABNORMAL LOW (ref 13.0–17.0)
MCH: 29 pg (ref 26.0–34.0)
MCHC: 32.6 g/dL (ref 30.0–36.0)
MCV: 88.8 fL (ref 80.0–100.0)
Platelets: 156 10*3/uL (ref 150–400)
RBC: 2.59 MIL/uL — ABNORMAL LOW (ref 4.22–5.81)
RDW: 13.7 % (ref 11.5–15.5)
WBC: 6.3 10*3/uL (ref 4.0–10.5)
nRBC: 0 % (ref 0.0–0.2)

## 2021-06-27 LAB — BASIC METABOLIC PANEL
Anion gap: 5 (ref 5–15)
Anion gap: 6 (ref 5–15)
BUN: 20 mg/dL (ref 8–23)
BUN: 20 mg/dL (ref 8–23)
CO2: 20 mmol/L — ABNORMAL LOW (ref 22–32)
CO2: 20 mmol/L — ABNORMAL LOW (ref 22–32)
Calcium: 7.8 mg/dL — ABNORMAL LOW (ref 8.9–10.3)
Calcium: 8.1 mg/dL — ABNORMAL LOW (ref 8.9–10.3)
Chloride: 113 mmol/L — ABNORMAL HIGH (ref 98–111)
Chloride: 111 mmol/L (ref 98–111)
Creatinine, Ser: 3.01 mg/dL — ABNORMAL HIGH (ref 0.61–1.24)
Creatinine, Ser: 2.89 mg/dL — ABNORMAL HIGH (ref 0.61–1.24)
GFR, Estimated: 22 mL/min — ABNORMAL LOW (ref >60)
GFR, Estimated: 23 mL/min — ABNORMAL LOW (ref >60)
Glucose, Bld: 94 mg/dL (ref 70–99)
Glucose, Bld: 105 mg/dL — ABNORMAL HIGH (ref 70–99)
Potassium: 5.2 mmol/L — ABNORMAL HIGH (ref 3.5–5.1)
Potassium: 4.7 mmol/L (ref 3.5–5.1)
Sodium: 138 mmol/L (ref 135–145)
Sodium: 137 mmol/L (ref 135–145)

## 2021-06-27 LAB — MAGNESIUM: Magnesium: 1.4 mg/dL — ABNORMAL LOW (ref 1.7–2.4)

## 2021-06-27 LAB — PROCALCITONIN: Procalcitonin: <0.1 ng/mL

## 2021-06-27 LAB — T4, FREE: Free T4: 1.16 ng/dL — ABNORMAL HIGH (ref 0.61–1.12)

## 2021-06-27 MED ORDER — SODIUM ZIRCONIUM CYCLOSILICATE 10 G PO PACK
10.0000 g | PACK | Freq: Once | ORAL | Status: AC
  Administered 2021-06-27: 10 g via ORAL
  Filled 2021-06-27: qty 1

## 2021-06-27 MED ORDER — SODIUM CHLORIDE 0.9 % IV BOLUS
500.0000 mL | Freq: Once | INTRAVENOUS | Status: AC
Start: 2021-06-27 — End: 2021-06-27
  Administered 2021-06-27: 500 mL via INTRAVENOUS

## 2021-06-27 MED ORDER — SODIUM CHLORIDE 0.9 % IV BOLUS
1000.0000 mL | Freq: Once | INTRAVENOUS | Status: DC
Start: 2021-06-27 — End: 2021-06-27

## 2021-06-27 NOTE — Assessment & Plan Note (Signed)
-  Fever, cough present for about a week prior to admission. ?-Empirically started on Rocephin and Zithromax--received 4 days ?-Chest x-ray negative ?-Follow Blood cultures and expectorated sputum cultures (pending currently). ?-Legionella and strep antigens negative. ?-4/19 CT chest--no infiltrates, no edema ?-4/19 viral resp panel>>coronavirus NL63 ?-PCT <0.10 ?

## 2021-06-27 NOTE — Assessment & Plan Note (Addendum)
Given lokelma and IVF ?-d/c losartan ?-improved ?

## 2021-06-27 NOTE — Assessment & Plan Note (Signed)
Personally reviewed EKG and tele ?-no concerning pauses ?

## 2021-06-27 NOTE — Discharge Summary (Signed)
?Physician Discharge Summary ?  ?Patient: Scott Savage MRN: [id number] DOB: 06-Nov-1955  ?Admit date:     06/23/2021  ?Discharge date: 06/27/21  ?Discharge Physician: Shanon Brow Ulysse Siemen  ? ?PCP: Michell Heinrich, DO  ? ?Recommendations at discharge:  ? Please follow up with primary care provider within 1-2 weeks ? Please repeat BMP and CBC in one week ? ? ? ? ? ?Hospital Course: ?History per Dr. Clearence Ped ? ?66 y.o. male with medical history significant of hypertension, hyperlipidemia presents to the ED with a chief complaint of cough and generalized weakness.  Patient reports that the symptoms started about 2 weeks ago.  He had a dry cough, but felt like he needed to bring sputum up and could not.  He became so fatigued.  He saw his PCP in [place] who prescribed steroids, albuterol, Z-Pak.  Patient reports that he felt better while he was on that regimen, but immediately after the regimen ended he started having the same symptoms.  He denies any fevers, chest pain, dyspnea.  Reports muscle soreness from coughing, and neck soreness from coughing, fatigue, and generalized weakness.  Patient also reports he has had dysuria for 2 weeks.  Patient denies any peripheral edema or orthopnea.  Patient does not have any other complaints. ?  ?He does not smoke, does not drink, does not use illicit drugs.  He is vaccinated for COVID.  Patient is full code. ? ?Assessment and Plan: ?* SIRS (systemic inflammatory response syndrome) (HCC) ?-Patient met SIRS criteria at time of admission with Temp 100.6, heart rate 114, respiratory rate 26, acute kidney injury ?-continue ceftriaxone and azithro ?-blood cultures--neg ?-lactic peaked 1.6 ?-check PCT ?-personally reviewed CXR--no infiltrates or edema ?-CT chest--no infiltrates or effusion or edema ?-viral resp panel--+coronavirus NL63 ?-COVID--neg ? ?Acute renal failure superimposed on stage 4 chronic kidney disease ([place]) ?-Discussed with patient he reported that he had been following at  [facility] and they have been told that he is currently on a stage IV ?-baseline creatinine 2.9-3.0 ?-d/c losartan and HCTZ ?-serum creatinine peaked 4.25 ?-renal US--no hydro; bilateral parenchymal thinning ?-serum creatinine 2.89 on day of d/c ? ?Acute bronchitis ?-Fever, cough present for about a week prior to admission. ?-Empirically started on Rocephin and Zithromax--received 4 days ?-Chest x-ray negative ?-Follow Blood cultures and expectorated sputum cultures (pending currently). ?-Legionella and strep antigens negative. ?-4/19 CT chest--no infiltrates, no edema ?-4/19 viral resp panel>>coronavirus NL63 ?-PCT <0.10 ? ?Thrombocytopenia ([place]) ?B12--352 ?Check folate--9.7 ?TSH 5.522 ?--likely due to viral infection>>improving at time of dc ? ?Hyperkalemia ?Given lokelma and IVF ?-d/c losartan ?-improved ? ?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(Wenckebach's) atrioventricular block ?Personally reviewed EKG and tele ?-no concerning pauses ? ?Anemia ?- Anemia of chronic kidney disease ?-No signs of overt bleeding ?-Hemoglobin in the 8-9 range not requiring transfusion ?-Continue to follow hemoglobin trend. ? ?Elevated troponin ?-Initial troponin elevated 34>>30 ?-EKG not demonstrating acute ischemic changes; patient denies chest pain. ?-Continue telemetry monitoring. ?-due to demand ischemia ? ? ? ? ?  ?Consultants: none ?Procedures performed: none  ?Disposition: Home ?Diet recommendation:  ?Cardiac diet ?DISCHARGE MEDICATION: ?Allergies as of 06/27/2021   ? ?   Reactions  ? Hydrocodone Itching, Nausea And Vomiting  ? ?  ? ?  ?Medication List  ?  ? ?STOP taking these medications   ? ?azithromycin 250 MG tablet ?Commonly known as: ZITHROMAX ?  ?hydrochlorothiazide 25 MG tablet ?Commonly known as: HYDRODIURIL ?  ?losartan 100 MG tablet ?Commonly known as: COZAAR ?  ?meloxicam 7.5 MG  tablet ?Commonly known as: Mobic ?  ?predniSONE 10 MG tablet ?Commonly known as: DELTASONE ?  ?promethazine-dextromethorphan 6.25-15 MG/5ML syrup ?Commonly  known as: PROMETHAZINE-DM ?  ? ?  ? ?TAKE these medications   ? ?acetaminophen 500 MG tablet ?Commonly known as: TYLENOL ?Take 1,000 mg by mouth every 6 (six) hours as needed for moderate pain or headache. ?  ?cholecalciferol 25 MCG (1000 UNIT) tablet ?Commonly known as: VITAMIN D3 ?Take 1,000 Units by mouth daily. ?  ?levocetirizine 5 MG tablet ?Commonly known as: XYZAL ?Take 5 mg by mouth daily. ?  ?pravastatin 40 MG tablet ?Commonly known as: PRAVACHOL ?Take 40 mg by mouth daily. ?  ?tamsulosin 0.4 MG Caps capsule ?Commonly known as: FLOMAX ?Take 0.4 mg by mouth at bedtime. ?  ?testosterone cypionate 200 MG/ML injection ?Commonly known as: DEPOTESTOSTERONE CYPIONATE ?Inject 200 mg into the muscle every 28 (twenty-eight) days. ?  ? ?  ? ? ?Discharge Exam: ?Filed Weights  ? 06/23/21 2354 06/24/21 0849  ?Weight: 102.1 kg 99.1 kg  ? ?HEENT:  /AT, No thrush, no icterus ?CV:  RRR, no rub, no S3, no S4 ?Lung:  fine bibasilar crackles. No wheeze ?Abd:  soft/+BS, NT ?Ext:  No edema, no lymphangitis, no synovitis, no rash ? ? ?Condition at discharge: stable ? ?The results of significant diagnostics from this hospitalization (including imaging, microbiology, ancillary and laboratory) are listed below for reference.  ? ?Imaging Studies: ?DG Chest 2 View ? ?Result Date: 06/24/2021 ?CLINICAL DATA:  Pneumonia EXAM: CHEST - 2 VIEW COMPARISON:  None. FINDINGS: The heart size and mediastinal contours are within normal limits. Both lungs are clear. The visualized skeletal structures are unremarkable. IMPRESSION: No active cardiopulmonary disease. Electronically Signed   By: Fidela Salisbury M.D.   On: 06/24/2021 00:42  ? ?CT CHEST WO CONTRAST ? ?Result Date: 06/26/2021 ?CLINICAL DATA:  Increased cough and weakness for 2 weeks. Nondiagnostic chest radiograph. EXAM: CT CHEST WITHOUT CONTRAST TECHNIQUE: Multidetector CT imaging of the chest was performed following the standard protocol without IV contrast. RADIATION DOSE REDUCTION: This  exam was performed according to the departmental dose-optimization program which includes automated exposure control, adjustment of the mA and/or kV according to patient size and/or use of iterative reconstruction technique. COMPARISON:  Chest radiograph 06/24/2021. No prior CT. FINDINGS: Cardiovascular: Aortic atherosclerosis.  Borderline cardiomegaly. Mediastinum/Nodes: No mediastinal or definite hilar adenopathy, given limitations of unenhanced CT. Lungs/Pleura: No pleural fluid.  Clear lungs. Upper Abdomen: Multiple tiny low-density well-circumscribed liver lesions are likely cysts and bile duct hamartomas. Normal imaged portions of the spleen, stomach, pancreas, adrenal glands. Mild bilateral renal cortical thinning. Suspect dependent gallstone or stones on 175/2. Abdominal aortic atherosclerosis. Musculoskeletal: No acute osseous abnormality. IMPRESSION: 1.  No acute process or explanation for cough. 2.  Aortic Atherosclerosis (ICD10-I70.0). 3. Possible cholelithiasis. Electronically Signed   By: Abigail Miyamoto M.D.   On: 06/26/2021 17:56  ? ?US RENAL ? ?Result Date: 06/24/2021 ?CLINICAL DATA:  Acute renal insufficiency. EXAM: RENAL / URINARY TRACT ULTRASOUND COMPLETE COMPARISON:  None. FINDINGS: Right Kidney: Renal measurements: 9.1 x 4.8 x 6.3 cm = volume: 144 mL. Mild diffuse parenchymal thinning/atrophy. Echogenicity within normal limits. No mass or hydronephrosis visualized. Left Kidney: Renal measurements: 9.0 x 5.6 x 5.2 cm = volume: 137 mL. Mild diffuse parenchymal thinning/atrophy. Echogenicity within normal limits. No mass or hydronephrosis visualized. Bladder: Mildly enlarged prostate gland with mass effect on bladder base. Mild diffuse wall thickening likely due to chronic bladder outlet obstruction. Other: None. IMPRESSION: Mild  diffuse bilateral renal parenchymal thinning/atrophy. No evidence of hydronephrosis. Mildly enlarged prostate gland and diffuse bladder wall thickening, likely due to chronic  bladder outlet obstruction. Electronically Signed   By: Marlaine Hind M.D.   On: 06/24/2021 10:51  ? ?MR Knee Right Wo Contrast ? ?Result Date: 06/18/2021 ?CLINICAL DATA:  Chronic right knee pain for 6 mont

## 2021-06-29 LAB — CULTURE, BLOOD (ROUTINE X 2)
Culture: NO GROWTH
Culture: NO GROWTH
Special Requests: ADEQUATE
Special Requests: ADEQUATE

## 2021-07-16 ENCOUNTER — Other Ambulatory Visit: Payer: Self-pay

## 2021-07-16 DIAGNOSIS — M1710 Unilateral primary osteoarthritis, unspecified knee: Secondary | ICD-10-CM

## 2021-07-16 DIAGNOSIS — G8929 Other chronic pain: Secondary | ICD-10-CM

## 2021-07-17 NOTE — TOC Progression Note (Signed)
Transition of Care (TOC) - Progression Note  ? ? ?Patient Details  ?Name: Scott Savage ?MRN: [id number] ?Date of Birth: 1955/06/10 ? ?Transition of Care (TOC) CM/SW Contact  ?Iona Beard, LCSWA ?Phone Number: ?07/17/2021, 12:20 PM ? ?Clinical Narrative:    ?CSW updated by Marjory Lies with [facility] that pt has surgery next week and they are unable to provide [facility] as pt lives in [place], [place]. Sarah with [facility] is also rep for [facility] in [place] and will review to see if she can accept. TOC to follow once pt arrives for surgery. ? ?  ?  ? ?Expected Discharge Plan and Services ?  ?  ?  ?  ?  ?                ?  ?  ?  ?  ?  ?  ?  ?  ?  ?  ? ? ?Social Determinants of Health (SDOH) Interventions ?  ? ?Readmission Risk Interventions ?   ? View : No data to display.  ?  ?  ?  ? ? ?

## 2021-07-20 ENCOUNTER — Encounter (HOSPITAL_COMMUNITY): Payer: Self-pay | Admitting: Anesthesiology

## 2021-07-23 NOTE — Patient Instructions (Signed)
? ? ? ? ? ? ? ? ? Scott Savage ? 07/23/2021  ?  ? '@PREFPERIOPPHARMACY'$ @ ? ? Your procedure is scheduled on  07/26/2021. ? ? Report to [facility] at  0600  A.M. ? ? Call this number if you have problems the morning of surgery: ? [phone] ? ? Remember: ? Do not eat or drink after midnight. ?  ?  ? Take these medicines the morning of surgery with A SIP OF WATER  ? ?                                             [place]. ?  ? ? Do not wear jewelry, make-up or nail polish. ? Do not wear lotions, powders, or perfumes, or deodorant. ? Do not shave 48 hours prior to surgery.  Men may shave face and neck. ? Do not bring valuables to the hospital. ? [facility] is not responsible for any belongings or valuables. ? ?Contacts, dentures or bridgework may not be worn into surgery.  Leave your suitcase in the car.  After surgery it may be brought to your room. ? ?For patients admitted to the hospital, discharge time will be determined by your treatment team. ? ?Patients discharged the day of surgery will not be allowed to drive home and must have someone with them for 24 hours.  ? ? ?Special instructions:   DO NOT smoke tobacco or vape for 24 hours before your procedure. ? ?Please read over the following fact sheets that you were given. ?Coughing and Deep Breathing, Blood Transfusion Information, Lab Information, Total Joint Packet, Surgical Site Infection Prevention, Anesthesia Post-op Instructions, and Care and Recovery After Surgery ?  ? ? ? Total Knee Replacement, Care After ?This sheet gives you information about how to care for yourself after your procedure. Your health care provider may also give you more specific instructions. If you have problems or questions, contact your health care provider. ?What can I expect after the procedure? ?After the procedure, it is common to have: ?Redness, pain, and swelling at the incision area. ?Stiffness. ?Discomfort. ?A small amount of blood or clear fluid coming from your  incision. ?Follow these instructions at home: ?Medicines ?Take over-the-counter and prescription medicines only as told by your health care provider. ?If you were prescribed a blood thinner (anticoagulant), take it as told by your health care provider. ?Ask your health care provider if the medicine prescribed to you: ?Requires you to avoid driving or using machinery. ?Can cause constipation. You may need to take these actions to prevent or treat constipation: ?Drink enough fluid to keep your urine pale yellow. ?Take over-the-counter or prescription medicines. ?Eat foods that are high in fiber, such as beans, whole grains, and fresh fruits and vegetables. ?Limit foods that are high in fat and processed sugars, such as fried or sweet foods. ?Incision care ? ?Follow instructions from your health care provider about how to take care of your incision. Make sure you: ?Wash your hands with soap and water for at least 20 seconds before and after you change your bandage (dressing). If soap and water are not available, use hand sanitizer. ?Change your dressing as told by your health care provider. ?Leave stitches (sutures), staples, skin glue, or adhesive strips in place. These skin closures may need to stay in place for 2 weeks or longer. If adhesive strip  edges start to loosen and curl up, you may trim the loose edges. Do not remove adhesive strips completely unless your health care provider tells you to do that. ?Do not take baths, swim, or use a hot tub until your health care provider approves. ?Check your incision area every day for signs of infection. Check for: ?More redness, swelling, or pain. ?More fluid or blood. ?Warmth. ?Pus or a bad smell. ?Activity ?Rest as told by your health care provider. ?Avoid sitting for a long time without moving. Get up to take short walks every 1-2 hours. This is important to improve blood flow and breathing. Ask for help if you feel weak or unsteady. ?Follow instructions from your  health care provider about using a walker, crutches, or a cane. ?You may use your legs to support (bear) your body weight as told by your health care provider. Follow instructions about how much weight you may safely support on your affected leg (weight-bearing restrictions). ?A physical therapist may show you how to get out of a bed and chair and how to go up and down stairs. You will first do this with a walker, crutches, or a cane and then without any of these devices. ?Once you are able to walk without a limp, you may stop using a walker, crutches, or a cane. ?Do exercises as told by your health care provider or physical therapist. ?Avoid high-impact activities, including running, jumping rope, and doing jumping jacks. ?Do not play contact sports until your health care provider approves. ?Return to your normal activities as told by your health care provider. Ask your health care provider what activities are safe for you. ?Managing pain, stiffness, and swelling ? ?If directed, put ice on your knee. To do this: ?Put ice in a plastic bag or use the icing device (cold flow pad) that you were given. Follow instructions from your health care provider about how to use the icing device. ?Place a towel between your skin and the bag or between your skin and the icing device. ?Leave the ice on for 20 minutes, 2-3 times a day. ?Remove the ice if your skin turns bright red. This is very important. If you cannot feel pain, heat, or cold, you have a greater risk of damage to the area. ?Move your toes often to reduce stiffness and swelling. ?Raise (elevate) your leg above the level of your heart while you are sitting or lying down. ?Use several pillows to keep your leg straight. ?Do not put a pillow just under the knee. If the knee is bent for a long time, this may lead to stiffness. ?Wear elastic knee support as told by your health care provider. ?Safety ? ?To help prevent falls, keep floors clear of objects you may trip over.  Place items that you may need within easy reach. ?Wear an apron or tool belt with pockets for carrying objects. This leaves your hands free to help with your balance. ?Ask your health care provider when it is safe to drive. ?General instructions ?Wear compression stockings as told by your health care provider. These stockings help to prevent blood clots and reduce swelling in your legs. ?Continue with breathing exercises. This helps prevent lung infection. ?Do not use any products that contain nicotine or tobacco. These products include cigarettes, chewing tobacco, and vaping devices, such as e-cigarettes. These can delay healing after surgery. If you need help quitting, ask your health care provider. ?Tell your health care provider if you plan to have dental work.  Also: ?Tell your dentist about your joint replacement. ?Ask your health care provider if there are any special instructions you need to follow before having dental care and routine cleanings. ?Keep all follow-up visits. This is important. ?Contact a health care provider if: ?You have a fever or chills. ?You have a cough or feel short of breath. ?Your medicine is not controlling your pain. ?You have any of these signs of infection: ?More redness, swelling, or pain around your incision. ?More fluid or blood coming from your incision. ?Warmth coming from your incision. ?Pus or a bad smell coming from your incision. ?You fall. ?Get help right away if: ?You have severe pain. ?You have trouble breathing. ?You have chest pain. ?You have redness, swelling, pain, or warmth in your calf or leg. ?Your incision breaks open after sutures or staples are removed. ?These symptoms may represent a serious problem that is an emergency. Do not wait to see if the symptoms will go away. Get medical help right away. Call your local emergency services (911 in the [place] Do not drive yourself to the hospital. ?Summary ?After the procedure, it is common to have pain and swelling  at the incision area, a small amount of blood or fluid coming from your incision, and stiffness. ?Follow instructions from your health care provider about how to take care of your incision. ?Use crutches, a walker,

## 2021-07-24 ENCOUNTER — Telehealth: Payer: Self-pay

## 2021-07-24 ENCOUNTER — Encounter (HOSPITAL_COMMUNITY)
Admission: RE | Admit: 2021-07-24 | Discharge: 2021-07-24 | Disposition: A | Discharge status: Home / Self Care | Payer: Medicare PPO | Source: Ambulatory Visit | Attending: Orthopedic Surgery | Admitting: Orthopedic Surgery

## 2021-07-24 ENCOUNTER — Encounter (HOSPITAL_COMMUNITY): Payer: Self-pay

## 2021-07-24 VITALS — BP 169/51 | HR 79 | Temp 98.6°F | Resp 18 | Ht 72.0 in | Wt 225.0 lb

## 2021-07-24 DIAGNOSIS — M25561 Pain in right knee: Secondary | ICD-10-CM | POA: Insufficient documentation

## 2021-07-24 DIAGNOSIS — D649 Anemia, unspecified: Secondary | ICD-10-CM

## 2021-07-24 DIAGNOSIS — N184 Chronic kidney disease, stage 4 (severe): Secondary | ICD-10-CM | POA: Insufficient documentation

## 2021-07-24 DIAGNOSIS — Z01818 Encounter for other preprocedural examination: Secondary | ICD-10-CM | POA: Diagnosis not present

## 2021-07-24 DIAGNOSIS — G8929 Other chronic pain: Secondary | ICD-10-CM | POA: Diagnosis not present

## 2021-07-24 DIAGNOSIS — D631 Anemia in chronic kidney disease: Secondary | ICD-10-CM | POA: Diagnosis not present

## 2021-07-24 LAB — BASIC METABOLIC PANEL
Anion gap: 7 (ref 5–15)
BUN: 30 mg/dL — ABNORMAL HIGH (ref 8–23)
CO2: 20 mmol/L — ABNORMAL LOW (ref 22–32)
Calcium: 9.1 mg/dL (ref 8.9–10.3)
Chloride: 112 mmol/L — ABNORMAL HIGH (ref 98–111)
Creatinine, Ser: 2.99 mg/dL — ABNORMAL HIGH (ref 0.61–1.24)
GFR, Estimated: 22 mL/min — ABNORMAL LOW (ref >60)
Glucose, Bld: 101 mg/dL — ABNORMAL HIGH (ref 70–99)
Potassium: 5 mmol/L (ref 3.5–5.1)
Sodium: 139 mmol/L (ref 135–145)

## 2021-07-24 LAB — CBC WITH DIFFERENTIAL/PLATELET
Abs Immature Granulocytes: 0.02 10*3/uL (ref 0.00–0.07)
Basophils Absolute: 0 10*3/uL (ref 0.0–0.1)
Basophils Relative: 0 %
Eosinophils Absolute: 0 10*3/uL (ref 0.0–0.5)
Eosinophils Relative: 1 %
HCT: 29.9 % — ABNORMAL LOW (ref 39.0–52.0)
Hemoglobin: 10.1 g/dL — ABNORMAL LOW (ref 13.0–17.0)
Immature Granulocytes: 0 %
Lymphocytes Relative: 55 %
Lymphs Abs: 3.1 10*3/uL (ref 0.7–4.0)
MCH: 28.9 pg (ref 26.0–34.0)
MCHC: 33.8 g/dL (ref 30.0–36.0)
MCV: 85.7 fL (ref 80.0–100.0)
Monocytes Absolute: 0.5 10*3/uL (ref 0.1–1.0)
Monocytes Relative: 9 %
Neutro Abs: 2 10*3/uL (ref 1.7–7.7)
Neutrophils Relative %: 35 %
Platelets: 162 10*3/uL (ref 150–400)
RBC: 3.49 MIL/uL — ABNORMAL LOW (ref 4.22–5.81)
RDW: 13.5 % (ref 11.5–15.5)
WBC: 5.7 10*3/uL (ref 4.0–10.5)
nRBC: 0 % (ref 0.0–0.2)

## 2021-07-24 LAB — PROTIME-INR
INR: 1.1 (ref 0.8–1.2)
Prothrombin Time: 14.5 seconds (ref 11.4–15.2)

## 2021-07-24 LAB — TYPE AND SCREEN
ABO/RH(D): A POS
Antibody Screen: NEGATIVE

## 2021-07-24 NOTE — Telephone Encounter (Signed)
Spoke with patient regarding scheduled TKR 07/26/21. Advised him that Dr. Aline Brochure will not be doing the surgery due to recent illness and low Hg. Advised patient that he needs more time to recovery from his recent illness/hospitalization. Let him know I will place a referral to hematologist and provider will call him. He verbalized understanding.  ?

## 2021-07-26 ENCOUNTER — Ambulatory Visit (HOSPITAL_COMMUNITY): Admission: RE | Admit: 2021-07-26 | Payer: Medicare PPO | Source: Home / Self Care | Admitting: Orthopedic Surgery

## 2021-07-26 ENCOUNTER — Encounter (HOSPITAL_COMMUNITY): Admission: RE | Payer: Self-pay | Source: Home / Self Care

## 2021-07-26 SURGERY — ARTHROPLASTY, KNEE, TOTAL
Anesthesia: Spinal | Site: Knee | Laterality: Right

## 2021-07-29 ENCOUNTER — Telehealth (INDEPENDENT_AMBULATORY_CARE_PROVIDER_SITE_OTHER): Payer: Medicare PPO | Admitting: Orthopedic Surgery

## 2021-07-29 ENCOUNTER — Telehealth: Payer: Self-pay | Admitting: Orthopedic Surgery

## 2021-07-29 NOTE — Telephone Encounter (Signed)
Left message.  Patient needs hemoglobin to be 12 and he needs to be referred to [facility] or [facility] for surgery because of his poor kidney function

## 2021-07-29 NOTE — Telephone Encounter (Signed)
Patient called to relay that he has the appointment scheduled with hematologist / [facility], per referral by Dr Aline Brochure, for 08/14/21;, 8:30am  patient is asking how long may it be after that for his surgery to be rescheduled? Ph# [phone]

## 2021-07-29 NOTE — Telephone Encounter (Signed)
[facility] med lat menisectomy // June 2 nd   Spoke to Mr. 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

## 2021-07-29 NOTE — Telephone Encounter (Signed)
Patient called, missed your call.  I relayed your message.  He still has questions and asks that you call him again please.

## 2021-07-30 ENCOUNTER — Other Ambulatory Visit: Payer: Self-pay

## 2021-07-30 DIAGNOSIS — Z01818 Encounter for other preprocedural examination: Secondary | ICD-10-CM

## 2021-07-30 DIAGNOSIS — M1710 Unilateral primary osteoarthritis, unspecified knee: Secondary | ICD-10-CM

## 2021-07-30 DIAGNOSIS — M25461 Effusion, right knee: Secondary | ICD-10-CM

## 2021-07-30 DIAGNOSIS — G8929 Other chronic pain: Secondary | ICD-10-CM

## 2021-07-30 NOTE — Telephone Encounter (Signed)
Patient said he missed the call.

## 2021-07-30 NOTE — Telephone Encounter (Signed)
Spoke with patient. Verified he has a walker for post surgery.

## 2021-08-01 ENCOUNTER — Telehealth: Payer: Self-pay | Admitting: Radiology

## 2021-08-01 ENCOUNTER — Other Ambulatory Visit: Payer: Self-pay | Admitting: Orthopedic Surgery

## 2021-08-01 NOTE — Telephone Encounter (Addendum)
-----   Message from Carole Civil, MD sent at 07/29/2021  5:10 PM EDT ----- Please schedule this patient for arthroscopy right knee partial medial and lateral meniscectomy for June 2 to follow

## 2021-08-01 NOTE — Progress Notes (Unsigned)
  Addend Delete Tag Copy   Candice Camp, RT Technologist Specialty:  Radiology Telephone Encounter Addendum Creation Time:  08/01/2021  1:29 PM   ----- Message from Carole Civil, MD sent at 07/29/2021  5:10 PM EDT ----- Please schedule this patient for arthroscopy right knee partial medial and lateral meniscectomy for June 2 to follow       Revision History

## 2021-08-06 NOTE — Patient Instructions (Signed)
Scott Savage  08/06/2021     '@[email or web address]'$ @   Your procedure is scheduled on  08/09/2021.   Report to [facility] at  1000  A.M.   Call this number if you have problems the morning of surgery:  [phone]   Remember:  Do not eat or drink after midnight.      Take these medicines the morning of surgery with A SIP OF WATER                                             None.    Do not wear jewelry, make-up or nail polish.  Do not wear lotions, powders, or perfumes, or deodorant.  Do not shave 48 hours prior to surgery.  Men may shave face and neck.  Do not bring valuables to the hospital.  [facility] is not responsible for any belongings or valuables.  Contacts, dentures or bridgework may not be worn into surgery.  Leave your suitcase in the car.  After surgery it may be brought to your room.  For patients admitted to the hospital, discharge time will be determined by your treatment team.  Patients discharged the day of surgery will not be allowed to drive home and must have someone with them for 24 hours.    Special instructions:   DO NOT smoke tobacco or vape for 24 hours before your procedure.  Please read over the following fact sheets that you were given. Coughing and Deep Breathing, Surgical Site Infection Prevention, Anesthesia Post-op Instructions, and Care and Recovery After Surgery      Arthroscopic Knee Ligament Repair, Care After This sheet gives you information about how to care for yourself after your procedure. Your health care provider may also give you more specific instructions. If you have problems or questions, contact your health care provider. What can I expect after the procedure? After the procedure, it is common to have: Soreness or pain in your knee. Bruising and swelling on your knee, calf, and ankle for 3-4 days. A small amount of fluid coming from the incisions. Follow these instructions at home: Medicines Take  over-the-counter and prescription medicines only as told by your health care provider. Ask your health care provider if the medicine prescribed to you: Requires you to avoid driving or using machinery. Can cause constipation. You may need to take these actions to prevent or treat constipation: Drink enough fluid to keep your urine pale yellow. Take over-the-counter or prescription medicines. Eat foods that are high in fiber, such as beans, whole grains, and fresh fruits and vegetables. Limit foods that are high in fat and processed sugars, such as fried or sweet foods. If you have a brace or immobilizer: Wear it as told by your health care provider. Remove it only as told by your health care provider. Loosen it if your toes tingle, become numb, or turn cold and blue. Keep it clean and dry. Ask your health care provider when it is safe to drive. Bathing Do not take baths, swim, or use a hot tub until your health care provider approves. Keep your bandage (dressing) dry until your health care provider says that it can be removed. If the brace or immobilizer is not waterproof: Do not let it get wet. Cover it with a watertight covering when you take a bath  or shower. Incision care  Follow instructions from your health care provider about how to take care of your incisions. Make sure you: Wash your hands with soap and water for at least 20 seconds before and after you change your dressing. If soap and water are not available, use hand sanitizer. Change your dressing as told by your health care provider. Leave stitches (sutures), skin glue, or adhesive strips in place. These skin closures may need to stay in place for 2 weeks or longer. If adhesive strip edges start to loosen and curl up, you may trim the loose edges. Do not remove adhesive strips completely unless your health care provider tells you to do that. Check your incision areas every day for signs of infection. Check for: Redness. More  swelling or pain. Blood or more fluid. Warmth. Pus or a bad smell. Managing pain, stiffness, and swelling  If directed, put ice on the affected area. To do this: If you have a removable brace or immobilizer, remove it as told by your health care provider. Put ice in a plastic bag. Place a towel between your skin and the bag. Leave the ice on for 20 minutes, 2-3 times a day. Remove the ice if your skin turns bright red. This is very important. If you cannot feel pain, heat, or cold, you have a greater risk of damage to the area. Move your toes often to reduce stiffness and swelling. Raise (elevate) the injured area above the level of your heart while you are sitting or lying down. Activity Do not use your knee to support your body weight until your health care provider says that you can. Use crutches or other devices as told by your health care provider. Do physical therapy exercises as told by your health care provider. Physical therapy will help you regain movement and strength in your knee. Follow instructions from your health care provider about: When you may start motion exercises. When you may start riding a stationary bike and doing other low-impact activities. When you may start to jog and do other high-impact activities. Do not lift anything that is heavier than 10 lb (4.5 kg), or the limit that you are told, until your health care provider says that it is safe. Ask your health care provider what activities are safe for you. General instructions Do not use any products that contain nicotine or tobacco, such as cigarettes, e-cigarettes, and chewing tobacco. These can delay healing. If you need help quitting, ask your health care provider. Wear compression stockings as told by your health care provider. These stockings help to prevent blood clots and reduce swelling in your legs. Keep all follow-up visits. This is important. Contact a health care provider if: You have any of these  signs of infection: Redness around an incision. Blood or more fluid coming from an incision. Warmth coming from an incision. Pus or a bad smell coming from an incision. More swelling or pain in your knee. A fever or chills. You have pain that does not get better with medicine. Your incision opens up. Get help right away if: You have trouble breathing. You have chest pain. You have increased pain or swelling in your calf or at the back of your knee. You have numbness and tingling near the knee joint or in the foot, ankle, or toes. You notice that your foot or toes look darker than normal or are cooler than normal. These symptoms may represent a serious problem that is an emergency. Do not  wait to see if the symptoms will go away. Get medical help right away. Call your local emergency services (911 in the [place] Do not drive yourself to the hospital. Summary After the procedure, it is common to have knee pain with bruising and swelling on your knee, calf, and ankle. Icing your knee and raising your leg above the level of your heart will help control the pain and swelling. Do physical therapy exercises as told by your health care provider. Physical therapy will help you regain movement and strength in your knee. This information is not intended to replace advice given to you by your health care provider. Make sure you discuss any questions you have with your health care provider. Document Revised: 07/25/2019 Document Reviewed: 07/25/2019 Elsevier Patient Education  [place] Anesthesia, Adult, Care After This sheet gives you information about how to care for yourself after your procedure. Your health care provider may also give you more specific instructions. If you have problems or questions, contact your health care provider. What can I expect after the procedure? After the procedure, the following side effects are common: Pain or discomfort at the IV  site. Nausea. Vomiting. Sore throat. Trouble concentrating. Feeling cold or chills. Feeling weak or tired. Sleepiness and fatigue. Soreness and body aches. These side effects can affect parts of the body that were not involved in surgery. Follow these instructions at home: For the time period you were told by your health care provider:  Rest. Do not participate in activities where you could fall or become injured. Do not drive or use machinery. Do not drink alcohol. Do not take sleeping pills or medicines that cause drowsiness. Do not make important decisions or sign legal documents. Do not take care of children on your own. Eating and drinking Follow any instructions from your health care provider about eating or drinking restrictions. When you feel hungry, start by eating small amounts of foods that are soft and easy to digest (bland), such as toast. Gradually return to your regular diet. Drink enough fluid to keep your urine pale yellow. If you vomit, rehydrate by drinking water, juice, or clear broth. General instructions If you have sleep apnea, surgery and certain medicines can increase your risk for breathing problems. Follow instructions from your health care provider about wearing your sleep device: Anytime you are sleeping, including during daytime naps. While taking prescription pain medicines, sleeping medicines, or medicines that make you drowsy. Have a responsible adult stay with you for the time you are told. It is important to have someone help care for you until you are awake and alert. Return to your normal activities as told by your health care provider. Ask your health care provider what activities are safe for you. Take over-the-counter and prescription medicines only as told by your health care provider. If you smoke, do not smoke without supervision. Keep all follow-up visits as told by your health care provider. This is important. Contact a health care  provider if: You have nausea or vomiting that does not get better with medicine. You cannot eat or drink without vomiting. You have pain that does not get better with medicine. You are unable to pass urine. You develop a skin rash. You have a fever. You have redness around your IV site that gets worse. Get help right away if: You have difficulty breathing. You have chest pain. You have blood in your urine or stool, or you vomit blood. Summary After the procedure, it is  common to have a sore throat or nausea. It is also common to feel tired. Have a responsible adult stay with you for the time you are told. It is important to have someone help care for you until you are awake and alert. When you feel hungry, start by eating small amounts of foods that are soft and easy to digest (bland), such as toast. Gradually return to your regular diet. Drink enough fluid to keep your urine pale yellow. Return to your normal activities as told by your health care provider. Ask your health care provider what activities are safe for you. This information is not intended to replace advice given to you by your health care provider. Make sure you discuss any questions you have with your health care provider. Document Revised: 11/10/2019 Document Reviewed: 06/09/2019 Elsevier Patient Education  [place]. How to Use Chlorhexidine for Bathing Chlorhexidine gluconate (CHG) is a germ-killing (antiseptic) solution that is used to clean the skin. It can get rid of the bacteria that normally live on the skin and can keep them away for about 24 hours. To clean your skin with CHG, you may be given: A CHG solution to use in the shower or as part of a sponge bath. A prepackaged cloth that contains CHG. Cleaning your skin with CHG may help lower the risk for infection: While you are staying in the intensive care unit of the hospital. If you have a vascular access, such as a central line, to provide short-term or  long-term access to your veins. If you have a catheter to drain urine from your bladder. If you are on a ventilator. A ventilator is a machine that helps you breathe by moving air in and out of your lungs. After surgery. What are the risks? Risks of using CHG include: A skin reaction. Hearing loss, if CHG gets in your ears and you have a perforated eardrum. Eye injury, if CHG gets in your eyes and is not rinsed out. The CHG product catching fire. Make sure that you avoid smoking and flames after applying CHG to your skin. Do not use CHG: If you have a chlorhexidine allergy or have previously reacted to chlorhexidine. On babies younger than 16 months of age. How to use CHG solution Use CHG only as told by your health care provider, and follow the instructions on the label. Use the full amount of CHG as directed. Usually, this is one bottle. During a shower Follow these steps when using CHG solution during a shower (unless your health care provider gives you different instructions): Start the shower. Use your normal soap and shampoo to wash your face and hair. Turn off the shower or move out of the shower stream. Pour the CHG onto a clean washcloth. Do not use any type of brush or rough-edged sponge. Starting at your neck, lather your body down to your toes. Make sure you follow these instructions: If you will be having surgery, pay special attention to the part of your body where you will be having surgery. Scrub this area for at least 1 minute. Do not use CHG on your head or face. If the solution gets into your ears or eyes, rinse them well with water. Avoid your genital area. Avoid any areas of skin that have broken skin, cuts, or scrapes. Scrub your back and under your arms. Make sure to wash skin folds. Let the lather sit on your skin for 1-2 minutes or as long as told by your health  care provider. Thoroughly rinse your entire body in the shower. Make sure that all body creases and  crevices are rinsed well. Dry off with a clean towel. Do not put any substances on your body afterward--such as powder, lotion, or perfume--unless you are told to do so by your health care provider. Only use lotions that are recommended by the manufacturer. Put on clean clothes or pajamas. If it is the night before your surgery, sleep in clean sheets.  During a sponge bath Follow these steps when using CHG solution during a sponge bath (unless your health care provider gives you different instructions): Use your normal soap and shampoo to wash your face and hair. Pour the CHG onto a clean washcloth. Starting at your neck, lather your body down to your toes. Make sure you follow these instructions: If you will be having surgery, pay special attention to the part of your body where you will be having surgery. Scrub this area for at least 1 minute. Do not use CHG on your head or face. If the solution gets into your ears or eyes, rinse them well with water. Avoid your genital area. Avoid any areas of skin that have broken skin, cuts, or scrapes. Scrub your back and under your arms. Make sure to wash skin folds. Let the lather sit on your skin for 1-2 minutes or as long as told by your health care provider. Using a different clean, wet washcloth, thoroughly rinse your entire body. Make sure that all body creases and crevices are rinsed well. Dry off with a clean towel. Do not put any substances on your body afterward--such as powder, lotion, or perfume--unless you are told to do so by your health care provider. Only use lotions that are recommended by the manufacturer. Put on clean clothes or pajamas. If it is the night before your surgery, sleep in clean sheets. How to use CHG prepackaged cloths Only use CHG cloths as told by your health care provider, and follow the instructions on the label. Use the CHG cloth on clean, dry skin. Do not use the CHG cloth on your head or face unless your health  care provider tells you to. When washing with the CHG cloth: Avoid your genital area. Avoid any areas of skin that have broken skin, cuts, or scrapes. Before surgery Follow these steps when using a CHG cloth to clean before surgery (unless your health care provider gives you different instructions): Using the CHG cloth, vigorously scrub the part of your body where you will be having surgery. Scrub using a back-and-forth motion for 3 minutes. The area on your body should be completely wet with CHG when you are done scrubbing. Do not rinse. Discard the cloth and let the area air-dry. Do not put any substances on the area afterward, such as powder, lotion, or perfume. Put on clean clothes or pajamas. If it is the night before your surgery, sleep in clean sheets.  For general bathing Follow these steps when using CHG cloths for general bathing (unless your health care provider gives you different instructions). Use a separate CHG cloth for each area of your body. Make sure you wash between any folds of skin and between your fingers and toes. Wash your body in the following order, switching to a new cloth after each step: The front of your neck, shoulders, and chest. Both of your arms, under your arms, and your hands. Your stomach and groin area, avoiding the genitals. Your right leg and foot. Your  left leg and foot. The back of your neck, your back, and your buttocks. Do not rinse. Discard the cloth and let the area air-dry. Do not put any substances on your body afterward--such as powder, lotion, or perfume--unless you are told to do so by your health care provider. Only use lotions that are recommended by the manufacturer. Put on clean clothes or pajamas. Contact a health care provider if: Your skin gets irritated after scrubbing. You have questions about using your solution or cloth. You swallow any chlorhexidine. Call your local poison control center (1-850 154 8195 in the [place] Get help  right away if: Your eyes itch badly, or they become very red or swollen. Your skin itches badly and is red or swollen. Your hearing changes. You have trouble seeing. You have swelling or tingling in your mouth or throat. You have trouble breathing. These symptoms may represent a serious problem that is an emergency. Do not wait to see if the symptoms will go away. Get medical help right away. Call your local emergency services (911 in the [place] Do not drive yourself to the hospital. Summary Chlorhexidine gluconate (CHG) is a germ-killing (antiseptic) solution that is used to clean the skin. Cleaning your skin with CHG may help to lower your risk for infection. You may be given CHG to use for bathing. It may be in a bottle or in a prepackaged cloth to use on your skin. Carefully follow your health care provider's instructions and the instructions on the product label. Do not use CHG if you have a chlorhexidine allergy. Contact your health care provider if your skin gets irritated after scrubbing. This information is not intended to replace advice given to you by your health care provider. Make sure you discuss any questions you have with your health care provider. Document Revised: 05/07/2020 Document Reviewed: 05/07/2020 Elsevier Patient Education  [place].

## 2021-08-07 ENCOUNTER — Encounter (HOSPITAL_COMMUNITY)
Admission: RE | Admit: 2021-08-07 | Discharge: 2021-08-07 | Disposition: A | Discharge status: Home / Self Care | Payer: Medicare PPO | Source: Ambulatory Visit | Attending: Orthopedic Surgery | Admitting: Orthopedic Surgery

## 2021-08-07 ENCOUNTER — Other Ambulatory Visit (HOSPITAL_COMMUNITY)
Admission: RE | Admit: 2021-08-07 | Discharge: 2021-08-07 | Disposition: A | Discharge status: Home / Self Care | Payer: Medicare PPO | Source: Ambulatory Visit | Attending: Nephrology | Admitting: Nephrology

## 2021-08-07 ENCOUNTER — Other Ambulatory Visit (HOSPITAL_COMMUNITY)
Admission: RE | Admit: 2021-08-07 | Discharge: 2021-08-07 | Disposition: A | Discharge status: Home / Self Care | Payer: Medicare PPO | Source: Ambulatory Visit | Attending: Orthopedic Surgery | Admitting: Orthopedic Surgery

## 2021-08-07 ENCOUNTER — Other Ambulatory Visit: Payer: Self-pay

## 2021-08-07 ENCOUNTER — Encounter (HOSPITAL_COMMUNITY): Payer: Self-pay

## 2021-08-07 DIAGNOSIS — Z01812 Encounter for preprocedural laboratory examination: Secondary | ICD-10-CM | POA: Diagnosis present

## 2021-08-07 DIAGNOSIS — Z01818 Encounter for other preprocedural examination: Secondary | ICD-10-CM

## 2021-08-07 LAB — CBC
HCT: 31.3 % — ABNORMAL LOW (ref 39.0–52.0)
Hemoglobin: 10.5 g/dL — ABNORMAL LOW (ref 13.0–17.0)
MCH: 28.6 pg (ref 26.0–34.0)
MCHC: 33.5 g/dL (ref 30.0–36.0)
MCV: 85.3 fL (ref 80.0–100.0)
Platelets: 191 10*3/uL (ref 150–400)
RBC: 3.67 MIL/uL — ABNORMAL LOW (ref 4.22–5.81)
RDW: 13.2 % (ref 11.5–15.5)
WBC: 6.6 10*3/uL (ref 4.0–10.5)
nRBC: 0 % (ref 0.0–0.2)

## 2021-08-07 LAB — BASIC METABOLIC PANEL
Anion gap: 4 — ABNORMAL LOW (ref 5–15)
BUN: 34 mg/dL — ABNORMAL HIGH (ref 8–23)
CO2: 21 mmol/L — ABNORMAL LOW (ref 22–32)
Calcium: 8.6 mg/dL — ABNORMAL LOW (ref 8.9–10.3)
Chloride: 115 mmol/L — ABNORMAL HIGH (ref 98–111)
Creatinine, Ser: 3.11 mg/dL — ABNORMAL HIGH (ref 0.61–1.24)
GFR, Estimated: 21 mL/min — ABNORMAL LOW (ref >60)
Glucose, Bld: 99 mg/dL (ref 70–99)
Potassium: 4.5 mmol/L (ref 3.5–5.1)
Sodium: 140 mmol/L (ref 135–145)

## 2021-08-07 LAB — PHOSPHORUS: Phosphorus: 4 mg/dL (ref 2.5–4.6)

## 2021-08-08 LAB — PTH, INTACT AND CALCIUM
Calcium, Total (PTH): 8.6 mg/dL (ref 8.6–10.2)
PTH: 70 pg/mL — ABNORMAL HIGH (ref 15–65)

## 2021-08-08 NOTE — H&P (Signed)
Scott Savage is an 66 y.o. male.   Chief Complaint: Pain right knee   HPI: This is a 65 year old male who initially consented for right total knee after given the options of total knee versus arthroscopic surgery and debridement of meniscus.  Initially he was proceeding with total knee replacement however he has low hemoglobin and some kidney issues that need to be worked out.  He has now changed his mind and opted for arthroscopy of the right knee partial medial meniscectomy  His complaints are pain swelling recurrent effusions.  He has had multiple aspirations and injections in the right knee with failure to relieve the symptoms.    Past Medical History:  Diagnosis Date   HTN (hypertension)    Hyperlipemia    PONV (postoperative nausea and vomiting)    Urinary complications    Uses flomax to help empty bladder    Past Surgical History:  Procedure Laterality Date   BIOPSY  08/01/2019   Procedure: BIOPSY;  Surgeon: Rogene Houston, MD;  Location: [facility];  Service: Endoscopy;;   COLONOSCOPY N/A 08/01/2019   Procedure: COLONOSCOPY;  Surgeon: Rogene Houston, MD;  Location: [facility];  Service: Endoscopy;  Laterality: N/A;  730   left foot  2011   [facility]   left shoulder  2008   Short Stay in [place]  07/04/2011   Procedure: RESECTION DISTAL CLAVICAL;  Surgeon: Carole Civil, MD;  Location: [facility];  Service: Orthopedics;  Laterality: Right;   SHOULDER OPEN ROTATOR CUFF REPAIR  07/04/2011   Procedure: ROTATOR CUFF REPAIR SHOULDER OPEN;  Surgeon: Carole Civil, MD;  Location: [facility];  Service: Orthopedics;  Laterality: Right;    Family History  Problem Relation Age of Onset   Cancer Mother    Cancer Father    Cancer Sister    Cancer Brother    Arthritis Other    Kidney disease Other    Social History:  reports that he has never smoked. He has never used smokeless tobacco. He reports that he does not drink  alcohol and does not use drugs.  Allergies:  Allergies  Allergen Reactions   Hydrocodone Itching and Nausea And Vomiting   Other Nausea And Vomiting    Anesthesia-significant nausea/vomiting    No medications prior to admission.    Results for orders placed or performed during the hospital encounter of 08/07/21 (from the past 48 hour(s))  Phosphorus     Status: None   Collection Time: 08/07/21  3:24 PM  Result Value Ref Range   Phosphorus 4.0 2.5 - 4.6 mg/dL    Comment: Performed at [facility], [address] [place], [place] [address]   No results found.  Review of Systems he reports no chest pain or shortness of breath.  He does have the joint pain as noted.  No heart symptoms or respiratory symptoms.  All other systems were negative    There were no vitals taken for this visit. Physical Exam   Constitutional vital signs are stable.  Normal development and nutrition grooming hygiene.  Medium body habitus.  Well-groomed.  Cardiovascular pulses are palpable temperature normal no edema or tenderness no varicose veins or peripheral edema  Lymph nodes related lymph nodes are negative  Gait and station  Patient has varus alignment to both knees he walks without supportive device there is no noticeable limp.  Right knee is tender over the medial joint line there is  a small effusion he has decreased range of motion but no contractures.  The knee is stable in all planes.  Muscle strength and tone were normal.  Skin shows normal skin envelope.  Neurological and psychiatric: Finger-nose test normal deep tendon reflexes normal sensation normal all 4 modalities.  Brief assessment of mental status he is oriented x3 mood and affect are normal    Assessment/Plan    MRI report   FINDINGS: MENISCI   Medial meniscus: There is near absence of the posterior horn of the medial meniscus with diffuse complex tearing. Mild-to-moderate extrusion of the body of the medial  meniscus. Attenuation of the central third of meniscal triangle of the body of the medial meniscus (coronal series 11 images 12 through 18).   Lateral meniscus: High-grade intermediate proton density signal intrasubstance degeneration within the anterior horn of the lateral meniscus with a linear fluid bright tear extending through the inferior articular surface of the middle third of the meniscal triangle (sagittal series 12 images 9 and 10). There is also a small transverse tear extending through the anterior wall of the lateral meniscus into a tiny anterior parameniscal cyst (sagittal series 12 images 11 through 13). More mild intermediate signal intrasubstance degeneration throughout the posterior horn of the lateral meniscus.   LIGAMENTS   Cruciates: Mild intermediate T2 signal interposed between intact ACL fibers, likely mucoid degeneration without definite tear. The PCL is intact.   Collaterals: The medial collateral ligament is intact. The fibular collateral ligament, biceps femoris tendon, iliotibial band, and popliteus tendon are intact.   CARTILAGE   Patellofemoral: Full-thickness cartilage loss throughout the entire lateral patellar facet and lateral trochlea. Full-thickness cartilage loss throughout the inferior medial trochlea.   Medial: Full-thickness cartilage loss throughout the medial 50% of the weight-bearing medial femoral condyle and the far medial aspect of the medial tibial plateau. Moderate subchondral marrow edema and cystic change at the medial aspect of the medial tibial plateau.   Lateral: Focal full-thickness cartilage loss within the mid transverse, mid to posterior aspect of the weight-bearing lateral femoral condyle as well as the far lateral aspect of the lateral femoral condyle.   Joint: Moderatejoint effusion. Normal Hoffa's fat pad. No plical thickening.   Popliteal Fossa: Small Baker's cyst containing tiny low signal likely loose  bodies. This Baker's cyst may connect to additional fluid seen just deep and lateral to the popliteal vessels (axial images 13 and 14, sagittal image 11) with additional tiny internal low signal loose bodies.   Extensor Mechanism: Mild chronic thickening of the proximal patellar tendon, likely chronic tendinosis. There is a punctate fluid bright likely tiny partial-thickness tear of the midsubstance of the proximal patellar tendon origin (sagittal series 13, images 12-14). The quadriceps tendon insertion is intact.   Bones:  No acute fracture or dislocation.   Other: Within the lateral suprapatellar joint space, there is a loose body with peripheral increased T1 and decreased T2 with fat saturation fat intensity and central decreased T1 and decreased T2 signal likely calcification, overall measuring up to 11 x 21 x 24 mm (transverse by [facility] by craniocaudal), axial image 5 and coronal series 11, image 17.   IMPRESSION:: IMPRESSION: 1. 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 2. Diffuse complex tearing of the posterior horn of the medial meniscus with near absence of meniscal tissue. 3. Oblique undersurface tear within the anterior horn of the lateral meniscus with additional horizontal tear extending through the anterior wall into a tiny anterior  parameniscal cyst. 4. Moderate joint effusion. 5. Small Baker's cyst containing low signal tiny loose bodies. Additional possibly connected fluid seen deep and lateral to the popliteal vessels, also containing small loose bodies. 6. Mild chronic proximal patellar tendinosis with likely small tiny midsubstance partial-thickness tear. 7. Loose body measuring up to 24 mm within the lateral suprapatellar joint space.     Electronically Signed   By: Yvonne Kendall M.D.   On: 06/18/2021 52:84  66 year old male with end-stage arthritis of his right knee 2 meniscal tears and a possible loose  body  The patient understands that he will need another procedure when he is in better medical condition.  However at this point to get some improvement in the function of his knee he is opted for arthroscopic surgery right knee, medial meniscectomy for medial meniscal tear, lateral meniscal tear, joint effusion, loose body, severe arthritis    Arther Abbott, MD 08/08/2021, 8:06 AM

## 2021-08-09 ENCOUNTER — Ambulatory Visit (HOSPITAL_BASED_OUTPATIENT_CLINIC_OR_DEPARTMENT_OTHER): Payer: Medicare PPO | Admitting: Anesthesiology

## 2021-08-09 ENCOUNTER — Encounter: Payer: Self-pay | Admitting: Orthopedic Surgery

## 2021-08-09 ENCOUNTER — Encounter (HOSPITAL_COMMUNITY): Payer: Self-pay | Admitting: Orthopedic Surgery

## 2021-08-09 ENCOUNTER — Ambulatory Visit (HOSPITAL_COMMUNITY): Payer: Medicare PPO | Admitting: Anesthesiology

## 2021-08-09 ENCOUNTER — Other Ambulatory Visit: Payer: Self-pay

## 2021-08-09 ENCOUNTER — Ambulatory Visit (HOSPITAL_COMMUNITY)
Admission: RE | Admit: 2021-08-09 | Discharge: 2021-08-09 | Disposition: A | Discharge status: Home / Self Care | Payer: Medicare PPO | Attending: Orthopedic Surgery | Admitting: Orthopedic Surgery

## 2021-08-09 ENCOUNTER — Encounter (HOSPITAL_COMMUNITY)
Admission: RE | Disposition: A | Discharge status: Home / Self Care | Payer: Self-pay | Source: Home / Self Care | Attending: Orthopedic Surgery

## 2021-08-09 DIAGNOSIS — I10 Essential (primary) hypertension: Secondary | ICD-10-CM | POA: Insufficient documentation

## 2021-08-09 DIAGNOSIS — X58XXXA Exposure to other specified factors, initial encounter: Secondary | ICD-10-CM | POA: Insufficient documentation

## 2021-08-09 DIAGNOSIS — S83281A Other tear of lateral meniscus, current injury, right knee, initial encounter: Secondary | ICD-10-CM | POA: Diagnosis not present

## 2021-08-09 DIAGNOSIS — S83231A Complex tear of medial meniscus, current injury, right knee, initial encounter: Secondary | ICD-10-CM | POA: Insufficient documentation

## 2021-08-09 DIAGNOSIS — M94261 Chondromalacia, right knee: Secondary | ICD-10-CM | POA: Insufficient documentation

## 2021-08-09 DIAGNOSIS — Y939 Activity, unspecified: Secondary | ICD-10-CM | POA: Insufficient documentation

## 2021-08-09 DIAGNOSIS — M23300 Other meniscus derangements, unspecified lateral meniscus, right knee: Secondary | ICD-10-CM

## 2021-08-09 DIAGNOSIS — M170 Bilateral primary osteoarthritis of knee: Secondary | ICD-10-CM | POA: Insufficient documentation

## 2021-08-09 DIAGNOSIS — S83241A Other tear of medial meniscus, current injury, right knee, initial encounter: Secondary | ICD-10-CM

## 2021-08-09 DIAGNOSIS — D649 Anemia, unspecified: Secondary | ICD-10-CM | POA: Insufficient documentation

## 2021-08-09 DIAGNOSIS — M23321 Other meniscus derangements, posterior horn of medial meniscus, right knee: Secondary | ICD-10-CM | POA: Diagnosis not present

## 2021-08-09 DIAGNOSIS — M1711 Unilateral primary osteoarthritis, right knee: Secondary | ICD-10-CM | POA: Diagnosis not present

## 2021-08-09 DIAGNOSIS — M25761 Osteophyte, right knee: Secondary | ICD-10-CM | POA: Diagnosis not present

## 2021-08-09 DIAGNOSIS — M659 Synovitis and tenosynovitis, unspecified: Secondary | ICD-10-CM | POA: Diagnosis not present

## 2021-08-09 HISTORY — PX: KNEE ARTHROSCOPY WITH LATERAL MENISECTOMY: SHX6193

## 2021-08-09 HISTORY — PX: KNEE ARTHROSCOPY WITH MEDIAL MENISECTOMY: SHX5651

## 2021-08-09 SURGERY — ARTHROSCOPY, KNEE, WITH LATERAL MENISCECTOMY
Anesthesia: General | Site: Knee | Laterality: Right

## 2021-08-09 MED ORDER — DIPHENHYDRAMINE HCL 50 MG/ML IJ SOLN
INTRAMUSCULAR | Status: DC | PRN
  Administered 2021-08-09: 12.5 mg via INTRAVENOUS

## 2021-08-09 MED ORDER — SCOPOLAMINE 1 MG/3DAYS TD PT72
MEDICATED_PATCH | TRANSDERMAL | Status: AC
  Filled 2021-08-09: qty 1

## 2021-08-09 MED ORDER — ONDANSETRON HCL 4 MG/2ML IJ SOLN
INTRAMUSCULAR | Status: AC
  Filled 2021-08-09: qty 2

## 2021-08-09 MED ORDER — PROPOFOL 10 MG/ML IV BOLUS
INTRAVENOUS | Status: AC
  Filled 2021-08-09: qty 20

## 2021-08-09 MED ORDER — FENTANYL CITRATE (PF) 100 MCG/2ML IJ SOLN
INTRAMUSCULAR | Status: AC
  Filled 2021-08-09: qty 2

## 2021-08-09 MED ORDER — ONDANSETRON HCL 4 MG/2ML IJ SOLN: 4.0000 mg | Freq: Once | INTRAMUSCULAR | Status: DC | PRN

## 2021-08-09 MED ORDER — BUPIVACAINE-EPINEPHRINE (PF) 0.5% -1:200000 IJ SOLN
INTRAMUSCULAR | Status: AC
  Filled 2021-08-09: qty 60

## 2021-08-09 MED ORDER — MIDAZOLAM HCL 2 MG/2ML IJ SOLN
INTRAMUSCULAR | Status: AC
  Filled 2021-08-09: qty 2

## 2021-08-09 MED ORDER — LIDOCAINE HCL (PF) 2 % IJ SOLN
INTRAMUSCULAR | Status: AC
  Filled 2021-08-09: qty 5

## 2021-08-09 MED ORDER — PROMETHAZINE HCL 12.5 MG PO TABS
12.5000 mg | ORAL_TABLET | Freq: Four times a day (QID) | ORAL | 0 refills | Status: AC | PRN
Start: 2021-08-09 — End: ?

## 2021-08-09 MED ORDER — DEXAMETHASONE SODIUM PHOSPHATE 10 MG/ML IJ SOLN
INTRAMUSCULAR | Status: AC
  Filled 2021-08-09: qty 1

## 2021-08-09 MED ORDER — OXYCODONE-ACETAMINOPHEN 5-325 MG PO TABS: 1.0000 | ORAL_TABLET | ORAL | 0 refills | Status: DC | PRN

## 2021-08-09 MED ORDER — LIDOCAINE 2% (20 MG/ML) 5 ML SYRINGE
INTRAMUSCULAR | Status: DC | PRN
  Administered 2021-08-09: 100 mg via INTRAVENOUS

## 2021-08-09 MED ORDER — MIDAZOLAM HCL 5 MG/5ML IJ SOLN
INTRAMUSCULAR | Status: DC | PRN
  Administered 2021-08-09: 2 mg via INTRAVENOUS

## 2021-08-09 MED ORDER — PHENYLEPHRINE 80 MCG/ML (10ML) SYRINGE FOR IV PUSH (FOR BLOOD PRESSURE SUPPORT)
PREFILLED_SYRINGE | INTRAVENOUS | Status: DC | PRN
  Administered 2021-08-09 (×2): 80 ug via INTRAVENOUS

## 2021-08-09 MED ORDER — PROPOFOL 10 MG/ML IV BOLUS
INTRAVENOUS | Status: DC | PRN
  Administered 2021-08-09: 200 mg via INTRAVENOUS

## 2021-08-09 MED ORDER — SODIUM CHLORIDE 0.9 % IR SOLN
Status: DC | PRN
  Administered 2021-08-09 (×2): 3000 mL

## 2021-08-09 MED ORDER — FENTANYL CITRATE PF 50 MCG/ML IJ SOSY: 25.0000 ug | PREFILLED_SYRINGE | INTRAMUSCULAR | Status: DC | PRN

## 2021-08-09 MED ORDER — DIPHENHYDRAMINE HCL 50 MG/ML IJ SOLN
INTRAMUSCULAR | Status: AC
  Filled 2021-08-09: qty 1

## 2021-08-09 MED ORDER — SCOPOLAMINE 1 MG/3DAYS TD PT72
MEDICATED_PATCH | TRANSDERMAL | Status: DC | PRN
  Administered 2021-08-09: 1 via TRANSDERMAL

## 2021-08-09 MED ORDER — DEXAMETHASONE SODIUM PHOSPHATE 10 MG/ML IJ SOLN
INTRAMUSCULAR | Status: DC | PRN
  Administered 2021-08-09: 10 mg via INTRAVENOUS

## 2021-08-09 MED ORDER — EPINEPHRINE PF 1 MG/ML IJ SOLN
INTRAMUSCULAR | Status: AC
  Filled 2021-08-09: qty 6

## 2021-08-09 MED ORDER — CEFAZOLIN SODIUM-DEXTROSE 2-4 GM/100ML-% IV SOLN
2.0000 g | INTRAVENOUS | Status: AC
  Administered 2021-08-09: 2 g via INTRAVENOUS
  Filled 2021-08-09: qty 100

## 2021-08-09 MED ORDER — ORAL CARE MOUTH RINSE: 15.0000 mL | Freq: Once | OROMUCOSAL | Status: AC

## 2021-08-09 MED ORDER — BUPIVACAINE-EPINEPHRINE (PF) 0.5% -1:200000 IJ SOLN
INTRAMUSCULAR | Status: DC | PRN
  Administered 2021-08-09: 7 mL via PERINEURAL

## 2021-08-09 MED ORDER — FENTANYL CITRATE (PF) 100 MCG/2ML IJ SOLN
INTRAMUSCULAR | Status: DC | PRN
  Administered 2021-08-09 (×3): 50 ug via INTRAVENOUS

## 2021-08-09 MED ORDER — LACTATED RINGERS IV SOLN: INTRAVENOUS | Status: DC

## 2021-08-09 MED ORDER — ONDANSETRON HCL 4 MG/2ML IJ SOLN
INTRAMUSCULAR | Status: DC | PRN
  Administered 2021-08-09: 4 mg via INTRAVENOUS

## 2021-08-09 MED ORDER — CHLORHEXIDINE GLUCONATE 0.12 % MT SOLN
15.0000 mL | Freq: Once | OROMUCOSAL | Status: AC
  Administered 2021-08-09: 15 mL via OROMUCOSAL

## 2021-08-09 SURGICAL SUPPLY — 49 items
ABLATOR ASPIRATE 50D MULTI-PRT (SURGICAL WAND) ×1 IMPLANT
APL PRP STRL LF DISP 70% ISPRP (MISCELLANEOUS) ×1
BAG HAMPER (MISCELLANEOUS) ×2 IMPLANT
BANDAGE ELASTIC 6 VELCRO NS (GAUZE/BANDAGES/DRESSINGS) ×1 IMPLANT
BLADE SHAVER TORPEDO 4X13 (MISCELLANEOUS) ×1 IMPLANT
BLADE SURG SZ11 CARB STEEL (BLADE) ×2 IMPLANT
BNDG CMPR STD VLCR NS LF 5.8X6 (GAUZE/BANDAGES/DRESSINGS) ×1
BNDG ELASTIC 6X5.8 VLCR NS LF (GAUZE/BANDAGES/DRESSINGS) ×2 IMPLANT
CHLORAPREP W/TINT 26 (MISCELLANEOUS) ×3 IMPLANT
CLOTH BEACON ORANGE TIMEOUT ST (SAFETY) ×2 IMPLANT
COOLER ICEMAN CLASSIC (MISCELLANEOUS) ×2 IMPLANT
CUFF TOURN SGL QUICK 34 (TOURNIQUET CUFF) ×2
CUFF TRNQT CYL 34X4.125X (TOURNIQUET CUFF) IMPLANT
DECANTER SPIKE VIAL GLASS SM (MISCELLANEOUS) ×3 IMPLANT
GAUZE 4X4 16PLY ~~LOC~~+RFID DBL (SPONGE) ×2 IMPLANT
GAUZE SPONGE 4X4 12PLY STRL (GAUZE/BANDAGES/DRESSINGS) ×2 IMPLANT
GAUZE SPONGE 4X4 16PLY XRAY LF (GAUZE/BANDAGES/DRESSINGS) ×2 IMPLANT
GAUZE XEROFORM 5X9 LF (GAUZE/BANDAGES/DRESSINGS) ×2 IMPLANT
GLOVE BIOGEL PI IND STRL 7.0 (GLOVE) ×2 IMPLANT
GLOVE BIOGEL PI IND STRL 8.5 (GLOVE) IMPLANT
GLOVE BIOGEL PI INDICATOR 7.0 (GLOVE) ×2
GLOVE BIOGEL PI INDICATOR 8.5 (GLOVE) ×1
GLOVE SKINSENSE NS SZ8.0 LF (GLOVE) ×1
GLOVE SKINSENSE STRL SZ8.0 LF (GLOVE) IMPLANT
GLOVE SURG SS PI 7.0 STRL IVOR (GLOVE) ×1 IMPLANT
GOWN STRL REUS W/TWL LRG LVL3 (GOWN DISPOSABLE) ×2 IMPLANT
GOWN STRL REUS W/TWL XL LVL3 (GOWN DISPOSABLE) ×2 IMPLANT
IV NS IRRIG 3000ML ARTHROMATIC (IV SOLUTION) ×4 IMPLANT
KIT BLADEGUARD II DBL (SET/KITS/TRAYS/PACK) ×2 IMPLANT
KIT TURNOVER CYSTO (KITS) ×2 IMPLANT
MANIFOLD NEPTUNE II (INSTRUMENTS) ×2 IMPLANT
MARKER SKIN DUAL TIP RULER LAB (MISCELLANEOUS) ×2 IMPLANT
NDL HYPO 21X1.5 SAFETY (NEEDLE) ×1 IMPLANT
NDL SPNL 18GX3.5 QUINCKE PK (NEEDLE) ×1 IMPLANT
NEEDLE HYPO 21X1.5 SAFETY (NEEDLE) ×2 IMPLANT
NEEDLE SPNL 18GX3.5 QUINCKE PK (NEEDLE) ×2 IMPLANT
NS IRRIG 1000ML POUR BTL (IV SOLUTION) ×2 IMPLANT
PACK ARTHRO LIMB DRAPE STRL (MISCELLANEOUS) ×2 IMPLANT
PAD ABD 5X9 TENDERSORB (GAUZE/BANDAGES/DRESSINGS) ×2 IMPLANT
PAD ARMBOARD 7.5X6 YLW CONV (MISCELLANEOUS) ×2 IMPLANT
PAD COLD SHLDR SM WRAP-ON (PAD) ×1 IMPLANT
PAD FOR LEG HOLDER (MISCELLANEOUS) ×2 IMPLANT
PADDING CAST COTTON 6X4 STRL (CAST SUPPLIES) ×2 IMPLANT
SET ARTHROSCOPY INST (INSTRUMENTS) ×2 IMPLANT
SET BASIN LINEN APH (SET/KITS/TRAYS/PACK) ×2 IMPLANT
SUT ETHILON 3 0 FSL (SUTURE) ×2 IMPLANT
SYR 30ML LL (SYRINGE) ×3 IMPLANT
TUBE CONNECTING 12X1/4 (SUCTIONS) ×4 IMPLANT
TUBING IN/OUT FLOW W/MAIN PUMP (TUBING) ×2 IMPLANT

## 2021-08-09 NOTE — Anesthesia Preprocedure Evaluation (Signed)
Anesthesia Evaluation  Patient identified by MRN, date of birth, ID band Patient awake    Reviewed: Allergy & Precautions, H&P , NPO status , Patient's Chart, lab work & pertinent test results, reviewed documented beta blocker date and time   History of Anesthesia Complications (+) PONV and history of anesthetic complications  Airway Mallampati: II  TM Distance: >3 FB Neck ROM: full    Dental no notable dental hx.    Pulmonary neg pulmonary ROS,    Pulmonary exam normal breath sounds clear to auscultation       Cardiovascular Exercise Tolerance: Good hypertension, negative cardio ROS   Rhythm:regular Rate:Normal     Neuro/Psych negative neurological ROS  negative psych ROS   GI/Hepatic negative GI ROS, Neg liver ROS,   Endo/Other  negative endocrine ROS  Renal/GU negative Renal ROS  negative genitourinary   Musculoskeletal   Abdominal   Peds  Hematology  (+) Blood dyscrasia, anemia ,   Anesthesia Other Findings   Reproductive/Obstetrics negative OB ROS                             Anesthesia Physical Anesthesia Plan  ASA: 2  Anesthesia Plan: General   Post-op Pain Management:    Induction:   PONV Risk Score and Plan: Propofol infusion  Airway Management Planned:   Additional Equipment:   Intra-op Plan:   Post-operative Plan:   Informed Consent: I have reviewed the patients History and Physical, chart, labs and discussed the procedure including the risks, benefits and alternatives for the proposed anesthesia with the patient or authorized representative who has indicated his/her understanding and acceptance.     Dental Advisory Given  Plan Discussed with: CRNA  Anesthesia Plan Comments:         Anesthesia Quick Evaluation  

## 2021-08-09 NOTE — Anesthesia Postprocedure Evaluation (Signed)
Anesthesia Post Note  Patient: Scott Savage  Procedure(s) Performed: KNEE ARTHROSCOPY WITH LATERAL MENISECTOMY (Right: Knee) KNEE ARTHROSCOPY WITH MEDIAL MENISECTOMY (Right: Knee)  Patient location during evaluation: Phase II Anesthesia Type: General Level of consciousness: awake Pain management: pain level controlled Vital Signs Assessment: post-procedure vital signs reviewed and stable Respiratory status: spontaneous breathing and respiratory function stable Cardiovascular status: blood pressure returned to baseline and stable Postop Assessment: no headache and no apparent nausea or vomiting Anesthetic complications: no Comments: Late entry   No notable events documented.   Last Vitals:  Vitals:   08/09/21 1345 08/09/21 1357  BP: (!) 150/83 (!) 155/99  Pulse: 97 88  Resp: 10 12  Temp:  36.6 C  SpO2: 99% 100%    Last Pain:  Vitals:   08/09/21 1357  TempSrc: Oral  PainSc: [place]

## 2021-08-09 NOTE — Transfer of Care (Signed)
Immediate Anesthesia Transfer of Care Note  Patient: Scott Savage  Procedure(s) Performed: KNEE ARTHROSCOPY WITH LATERAL MENISECTOMY (Right: Knee) KNEE ARTHROSCOPY WITH MEDIAL MENISECTOMY (Right: Knee)  Patient Location: PACU  Anesthesia Type:General  Level of Consciousness: awake, alert , oriented and patient cooperative  Airway & Oxygen Therapy: Patient Spontanous Breathing and Patient connected to nasal cannula oxygen  Post-op Assessment: Report given to RN, Post -op Vital signs reviewed and stable and Patient moving all extremities  Post vital signs: Reviewed and stable  Last Vitals:  Vitals Value Taken Time  BP 147/85 08/09/21 1315  Temp 36.6 C 08/09/21 1315  Pulse 100 08/09/21 1320  Resp 26 08/09/21 1319  SpO2 96 % 08/09/21 1320  Vitals shown include unvalidated device data.  Last Pain:  Vitals:   08/09/21 1100  TempSrc: Oral  PainSc: 0-No pain      Patients Stated Pain Goal: 8 (20/94/70 9628)  Complications: No notable events documented.

## 2021-08-09 NOTE — Anesthesia Procedure Notes (Signed)
Procedure Name: LMA Insertion Date/Time: 08/09/2021 12:28 PM Performed by: Myna Bright, CRNA Pre-anesthesia Checklist: Patient identified, Emergency Drugs available, Suction available and Patient being monitored Patient Re-evaluated:Patient Re-evaluated prior to induction Oxygen Delivery Method: Circle system utilized Preoxygenation: Pre-oxygenation with 100% oxygen Induction Type: IV induction Ventilation: Mask ventilation without difficulty LMA: LMA inserted LMA Size: 5.0 Number of attempts: 1 Placement Confirmation: positive ETCO2 and breath sounds checked- equal and bilateral Tube secured with: Tape Dental Injury: Teeth and Oropharynx as per pre-operative assessment

## 2021-08-09 NOTE — Interval H&P Note (Signed)
History and Physical Interval Note:  08/09/2021 11:48 AM  Scott Savage  has presented today for surgery, with the diagnosis of torn meniscus right knee.  The various methods of treatment have been discussed with the patient and family. After consideration of risks, benefits and other options for treatment, the patient has consented to  Procedure(s): KNEE ARTHROSCOPY WITH LATERAL MENISECTOMY (Right) KNEE ARTHROSCOPY WITH MEDIAL MENISECTOMY (Right) as a surgical intervention.  The patient's history has been reviewed, patient examined, no change in status, stable for surgery.  I have reviewed the patient's chart and labs.  Questions were answered to the patient's satisfaction.     Arther Abbott

## 2021-08-09 NOTE — Op Note (Signed)
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 The chart was reviewed and updated. The surgical site was confirmed as right knee and marked with an indelible marker.  The patient was taken to the operating room for anesthesia. After successful general anesthesia, Ancef was used as IV antibiotics.  The patient was placed in the supine position with the (right) the operative extremity in an arthroscopic leg holder and the opposite extremity in a padded leg holder.  The timeout was executed.  A lateral portal was established with an 11 blade and the scope was introduced into the joint. A diagnostic arthroscopy was performed in circumferential manner examining the entire knee joint. A medial portal was established and the diagnostic arthroscopy was repeated using a probe to palpate intra-articular structures as they were encountered.    The medial meniscus was resected using a duckbill forceps. The meniscal fragments were removed with a motorized shaver. The meniscus was balanced with a combination of a motorized shaver and a 50 ArthroCare wand until a stable rim was  obtained.  The lateral meniscus was debrided at its anterior horn with a straight shaver blade  Synovial tissue was resected to improve visualization  The arthroscopic pump was placed on the wash mode and any excess debris was removed from the joint using suction.  30 cc of Marcaine with epinephrine was injected through the arthroscope.  The portals were closed with 3-0 nylon suture.  A sterile bandage, Ace wrap and Cryo/Cuff was placed and the Cryo/Cuff was activated. The patient was taken to the recovery room in stable condition.  PHYSICIAN ASSISTANT: no  ASSISTANTS: none   ANESTHESIA:   General  EBL:  none   BLOOD ADMINISTERED:none  DRAINS: none   LOCAL MEDICATIONS USED:  MARCAINE     SPECIMEN:  No Specimen  DISPOSITION OF SPECIMEN:  N/A  COUNTS:  YES   DICTATION: .Dragon Dictation  PLAN OF CARE: Discharge to home after PACU  PATIENT DISPOSITION:  PACU - hemodynamically stable.   Delay start of Pharmacological VTE agent (>24hrs) due to surgical blood loss or risk of bleeding: not applicable  POST OP PLAN  WB as tolerated SUTURES OUT IN A WEEK  AROM  BRACE not necessary

## 2021-08-09 NOTE — Brief Op Note (Signed)
08/09/2021  1:10 PM  PATIENT:  Scott Savage  66 y.o. male  PRE-OPERATIVE DIAGNOSIS:  torn meniscus right knee  POST-OPERATIVE DIAGNOSIS:  torn medial and lateral meniscus right knee  Findings severe arthritis all 3 compartments especially the patellofemoral and medial compartment grade 4 chondral changes on both sides of both joints with torn posterior horn medial meniscus complex tear, anterior horn lateral meniscus tear, moderate synovitis  PROCEDURE:  Procedure(s): KNEE ARTHROSCOPY WITH LATERAL MENISECTOMY (Right) KNEE ARTHROSCOPY WITH MEDIAL MENISECTOMY (Right)  SURGEON:  Surgeon(s) and Role:    Carole Civil, MD - Primary  PHYSICIAN ASSISTANT:   ASSISTANTS: none   ANESTHESIA:   general  EBL:  25 mL   BLOOD ADMINISTERED:none  DRAINS: none   LOCAL MEDICATIONS USED:  MARCAINE     SPECIMEN:  No Specimen  DISPOSITION OF SPECIMEN:  N/A  COUNTS:  YES  TOURNIQUET:  * Missing tourniquet times found for documented tourniquets in log: [id number] *  DICTATION: .Dragon Dictation  PLAN OF CARE: Discharge to home after PACU  PATIENT DISPOSITION:  PACU - hemodynamically stable.   Delay start of Pharmacological VTE agent (>24hrs) due to surgical blood loss or risk of bleeding: not applicable

## 2021-08-13 ENCOUNTER — Ambulatory Visit (HOSPITAL_COMMUNITY): Payer: Medicare PPO | Admitting: Physical Therapy

## 2021-08-13 LAB — 25-HYDROXY VITAMIN D LCMS D2+D3
25-Hydroxy, Vitamin D-2: 2.7 ng/mL
25-Hydroxy, Vitamin D-3: 29 ng/mL
25-Hydroxy, Vitamin D: 32 ng/mL

## 2021-08-13 NOTE — Progress Notes (Deleted)
NO SHOW

## 2021-08-14 ENCOUNTER — Inpatient Hospital Stay (HOSPITAL_COMMUNITY): Payer: Medicare PPO | Admitting: Hematology

## 2021-08-14 ENCOUNTER — Encounter (HOSPITAL_COMMUNITY): Payer: Self-pay | Admitting: Orthopedic Surgery

## 2021-08-15 ENCOUNTER — Other Ambulatory Visit: Payer: Self-pay

## 2021-08-15 ENCOUNTER — Ambulatory Visit (HOSPITAL_COMMUNITY): Payer: Medicare PPO | Attending: Orthopedic Surgery | Admitting: Physical Therapy

## 2021-08-15 ENCOUNTER — Ambulatory Visit (HOSPITAL_COMMUNITY): Payer: Medicare PPO | Admitting: Physical Therapy

## 2021-08-15 ENCOUNTER — Encounter (HOSPITAL_COMMUNITY): Payer: Self-pay | Admitting: Emergency Medicine

## 2021-08-15 ENCOUNTER — Inpatient Hospital Stay (HOSPITAL_COMMUNITY)
Admission: EM | Admit: 2021-08-15 | Discharge: 2021-08-19 | DRG: 683 | Disposition: A | Discharge status: Home / Self Care | Payer: Medicare PPO | Attending: Internal Medicine | Admitting: Internal Medicine

## 2021-08-15 DIAGNOSIS — Z20822 Contact with and (suspected) exposure to covid-19: Secondary | ICD-10-CM | POA: Diagnosis present

## 2021-08-15 DIAGNOSIS — D631 Anemia in chronic kidney disease: Secondary | ICD-10-CM | POA: Diagnosis present

## 2021-08-15 DIAGNOSIS — D696 Thrombocytopenia, unspecified: Secondary | ICD-10-CM | POA: Diagnosis present

## 2021-08-15 DIAGNOSIS — M1711 Unilateral primary osteoarthritis, right knee: Secondary | ICD-10-CM | POA: Diagnosis present

## 2021-08-15 DIAGNOSIS — M1710 Unilateral primary osteoarthritis, unspecified knee: Secondary | ICD-10-CM | POA: Insufficient documentation

## 2021-08-15 DIAGNOSIS — R6510 Systemic inflammatory response syndrome (SIRS) of non-infectious origin without acute organ dysfunction: Secondary | ICD-10-CM | POA: Diagnosis not present

## 2021-08-15 DIAGNOSIS — N400 Enlarged prostate without lower urinary tract symptoms: Secondary | ICD-10-CM | POA: Diagnosis present

## 2021-08-15 DIAGNOSIS — M25462 Effusion, left knee: Secondary | ICD-10-CM | POA: Diagnosis present

## 2021-08-15 DIAGNOSIS — E875 Hyperkalemia: Secondary | ICD-10-CM | POA: Diagnosis present

## 2021-08-15 DIAGNOSIS — I441 Atrioventricular block, second degree: Secondary | ICD-10-CM | POA: Diagnosis present

## 2021-08-15 DIAGNOSIS — M109 Gout, unspecified: Secondary | ICD-10-CM | POA: Diagnosis present

## 2021-08-15 DIAGNOSIS — N189 Chronic kidney disease, unspecified: Principal | ICD-10-CM

## 2021-08-15 DIAGNOSIS — N401 Enlarged prostate with lower urinary tract symptoms: Secondary | ICD-10-CM | POA: Diagnosis present

## 2021-08-15 DIAGNOSIS — Z885 Allergy status to narcotic agent status: Secondary | ICD-10-CM

## 2021-08-15 DIAGNOSIS — M25561 Pain in right knee: Secondary | ICD-10-CM | POA: Insufficient documentation

## 2021-08-15 DIAGNOSIS — R338 Other retention of urine: Secondary | ICD-10-CM | POA: Diagnosis present

## 2021-08-15 DIAGNOSIS — E860 Dehydration: Secondary | ICD-10-CM | POA: Diagnosis present

## 2021-08-15 DIAGNOSIS — G8929 Other chronic pain: Secondary | ICD-10-CM | POA: Insufficient documentation

## 2021-08-15 DIAGNOSIS — R262 Difficulty in walking, not elsewhere classified: Secondary | ICD-10-CM | POA: Insufficient documentation

## 2021-08-15 DIAGNOSIS — N184 Chronic kidney disease, stage 4 (severe): Secondary | ICD-10-CM | POA: Diagnosis present

## 2021-08-15 DIAGNOSIS — N179 Acute kidney failure, unspecified: Secondary | ICD-10-CM | POA: Diagnosis not present

## 2021-08-15 DIAGNOSIS — M25661 Stiffness of right knee, not elsewhere classified: Secondary | ICD-10-CM | POA: Insufficient documentation

## 2021-08-15 DIAGNOSIS — M1712 Unilateral primary osteoarthritis, left knee: Secondary | ICD-10-CM | POA: Diagnosis present

## 2021-08-15 DIAGNOSIS — Z79899 Other long term (current) drug therapy: Secondary | ICD-10-CM

## 2021-08-15 DIAGNOSIS — I129 Hypertensive chronic kidney disease with stage 1 through stage 4 chronic kidney disease, or unspecified chronic kidney disease: Secondary | ICD-10-CM | POA: Diagnosis present

## 2021-08-15 DIAGNOSIS — E871 Hypo-osmolality and hyponatremia: Secondary | ICD-10-CM | POA: Diagnosis present

## 2021-08-15 DIAGNOSIS — M6281 Muscle weakness (generalized): Secondary | ICD-10-CM | POA: Insufficient documentation

## 2021-08-15 DIAGNOSIS — E785 Hyperlipidemia, unspecified: Secondary | ICD-10-CM | POA: Diagnosis present

## 2021-08-15 LAB — COMPREHENSIVE METABOLIC PANEL
ALT: 22 U/L (ref 0–44)
AST: 24 U/L (ref 15–41)
Albumin: 3.4 g/dL — ABNORMAL LOW (ref 3.5–5.0)
Alkaline Phosphatase: 98 U/L (ref 38–126)
Anion gap: 12 (ref 5–15)
BUN: 60 mg/dL — ABNORMAL HIGH (ref 8–23)
CO2: 17 mmol/L — ABNORMAL LOW (ref 22–32)
Calcium: 8.4 mg/dL — ABNORMAL LOW (ref 8.9–10.3)
Chloride: 99 mmol/L (ref 98–111)
Creatinine, Ser: 4.45 mg/dL — ABNORMAL HIGH (ref 0.61–1.24)
GFR, Estimated: 14 mL/min — ABNORMAL LOW (ref >60)
Glucose, Bld: 132 mg/dL — ABNORMAL HIGH (ref 70–99)
Potassium: 4.7 mmol/L (ref 3.5–5.1)
Sodium: 128 mmol/L — ABNORMAL LOW (ref 135–145)
Total Bilirubin: 1.1 mg/dL (ref 0.3–1.2)
Total Protein: 7.5 g/dL (ref 6.5–8.1)

## 2021-08-15 LAB — RESP PANEL BY RT-PCR (FLU A&B, COVID) ARPGX2
Influenza A by PCR: NEGATIVE
Influenza B by PCR: NEGATIVE
SARS Coronavirus 2 by RT PCR: NEGATIVE

## 2021-08-15 LAB — CBC WITH DIFFERENTIAL/PLATELET
Abs Immature Granulocytes: 0.04 10*3/uL (ref 0.00–0.07)
Basophils Absolute: 0 10*3/uL (ref 0.0–0.1)
Basophils Relative: 0 %
Eosinophils Absolute: 0 10*3/uL (ref 0.0–0.5)
Eosinophils Relative: 0 %
HCT: 29.4 % — ABNORMAL LOW (ref 39.0–52.0)
Hemoglobin: 10 g/dL — ABNORMAL LOW (ref 13.0–17.0)
Immature Granulocytes: 1 %
Lymphocytes Relative: 29 %
Lymphs Abs: 2.5 10*3/uL (ref 0.7–4.0)
MCH: 28.3 pg (ref 26.0–34.0)
MCHC: 34 g/dL (ref 30.0–36.0)
MCV: 83.3 fL (ref 80.0–100.0)
Monocytes Absolute: 0.9 10*3/uL (ref 0.1–1.0)
Monocytes Relative: 10 %
Neutro Abs: 5.3 10*3/uL (ref 1.7–7.7)
Neutrophils Relative %: 60 %
Platelets: 224 10*3/uL (ref 150–400)
RBC: 3.53 MIL/uL — ABNORMAL LOW (ref 4.22–5.81)
RDW: 13.1 % (ref 11.5–15.5)
WBC: 8.8 10*3/uL (ref 4.0–10.5)
nRBC: 0 % (ref 0.0–0.2)

## 2021-08-15 LAB — GROUP A STREP BY PCR: Group A Strep by PCR: NOT DETECTED

## 2021-08-15 MED ORDER — ONDANSETRON HCL 4 MG/2ML IJ SOLN
4.0000 mg | Freq: Four times a day (QID) | INTRAMUSCULAR | Status: DC | PRN
  Administered 2021-08-16 – 2021-08-18 (×3): 4 mg via INTRAVENOUS
  Filled 2021-08-15 (×3): qty 2

## 2021-08-15 MED ORDER — LINEZOLID 600 MG/300ML IV SOLN
INTRAVENOUS | Status: AC
  Filled 2021-08-15: qty 300

## 2021-08-15 MED ORDER — ONDANSETRON HCL 4 MG PO TABS
4.0000 mg | ORAL_TABLET | Freq: Four times a day (QID) | ORAL | Status: DC | PRN
  Administered 2021-08-16: 4 mg via ORAL
  Filled 2021-08-15: qty 1

## 2021-08-15 MED ORDER — LINEZOLID 600 MG/300ML IV SOLN
600.0000 mg | Freq: Two times a day (BID) | INTRAVENOUS | Status: DC
  Administered 2021-08-15 – 2021-08-16 (×3): 600 mg via INTRAVENOUS
  Filled 2021-08-15 (×6): qty 300

## 2021-08-15 MED ORDER — FENTANYL CITRATE PF 50 MCG/ML IJ SOSY
50.0000 ug | PREFILLED_SYRINGE | Freq: Once | INTRAMUSCULAR | Status: AC
  Administered 2021-08-15: 50 ug via INTRAVENOUS
  Filled 2021-08-15: qty 1

## 2021-08-15 MED ORDER — SODIUM CHLORIDE 0.9 % IV SOLN
2.0000 g | INTRAVENOUS | Status: DC
  Administered 2021-08-15 – 2021-08-16 (×2): 2 g via INTRAVENOUS
  Filled 2021-08-15 (×2): qty 12.5

## 2021-08-15 MED ORDER — PRAVASTATIN SODIUM 40 MG PO TABS
40.0000 mg | ORAL_TABLET | Freq: Every evening | ORAL | Status: DC
  Administered 2021-08-15 – 2021-08-18 (×4): 40 mg via ORAL
  Filled 2021-08-15 (×5): qty 1

## 2021-08-15 MED ORDER — TAMSULOSIN HCL 0.4 MG PO CAPS
0.4000 mg | ORAL_CAPSULE | Freq: Every day | ORAL | Status: DC
  Administered 2021-08-15 – 2021-08-18 (×4): 0.4 mg via ORAL
  Filled 2021-08-15 (×4): qty 1

## 2021-08-15 MED ORDER — LACTATED RINGERS IV BOLUS
1000.0000 mL | Freq: Once | INTRAVENOUS | Status: AC
  Administered 2021-08-15: 1000 mL via INTRAVENOUS

## 2021-08-15 MED ORDER — LACTATED RINGERS IV SOLN: INTRAVENOUS | Status: DC

## 2021-08-15 MED ORDER — SODIUM CHLORIDE 0.9 % IV SOLN: INTRAVENOUS | Status: DC

## 2021-08-15 MED ORDER — HEPARIN SODIUM (PORCINE) 5000 UNIT/ML IJ SOLN
5000.0000 [IU] | Freq: Three times a day (TID) | INTRAMUSCULAR | Status: DC
  Administered 2021-08-15 – 2021-08-19 (×11): 5000 [IU] via SUBCUTANEOUS
  Filled 2021-08-15 (×11): qty 1

## 2021-08-15 MED ORDER — OXYCODONE-ACETAMINOPHEN 5-325 MG PO TABS
1.0000 | ORAL_TABLET | Freq: Once | ORAL | Status: AC
  Administered 2021-08-15: 1 via ORAL
  Filled 2021-08-15: qty 1

## 2021-08-15 MED ORDER — HYDROMORPHONE HCL 1 MG/ML IJ SOLN
0.5000 mg | INTRAMUSCULAR | Status: DC | PRN
  Administered 2021-08-16 – 2021-08-17 (×4): 0.5 mg via INTRAVENOUS
  Filled 2021-08-15 (×4): qty 0.5

## 2021-08-15 MED ORDER — OXYCODONE-ACETAMINOPHEN 5-325 MG PO TABS
1.0000 | ORAL_TABLET | ORAL | Status: DC | PRN
  Administered 2021-08-16 (×3): 1 via ORAL
  Filled 2021-08-15 (×3): qty 1

## 2021-08-15 NOTE — ED Provider Notes (Signed)
[facility] Provider Note   CSN: [id number] Arrival date & time: 08/15/21  1646     History  Chief Complaint  Patient presents with   Post-op Problem    Scott Savage is a 66 y.o. male.  HPI 66 year old male presents with right knee pain and lightheadedness.  He states has been dealing with the knee pain since 6/3, 1 day after his knee surgery by Dr. Aline Brochure.  There was a delay in getting his pain medicines through the pharmacy and he could not get them filled until 3 days ago.  He has been dealing with severe pain whenever he tries to move his knee.  Pain at rest is moderate.  The knee has been hurting essentially ever since he was postoperative.  He has some mild swelling that he states has been there since surgery.  He is able to flex and extend his knee but it is painful.  He has been taking the oxycodone as prescribed but still has significant pain.  He does not feel like his knee has become swollen and has not had any fevers though his wife states he gets chilled at night only.  He was at PT today and due to severe pain it was recommended he come to the hospital.  He is due to see Dr. Aline Brochure tomorrow.  Patient tells me he gets lightheaded whenever he stands or walks.  He has not been eating and drinking well per his and wife report.  No chest pain, shortness of breath, headache or focal weakness.  Home Medications Prior to Admission medications   Medication Sig Start Date End Date Taking? Authorizing Provider  acetaminophen (TYLENOL) 500 MG tablet Take 1,000 mg by mouth every 6 (six) hours as needed for moderate pain or headache.    [provider]  cholecalciferol (VITAMIN D3) 25 MCG (1000 UNIT) tablet Take 1,000 Units by mouth in the morning.    [provider]  levocetirizine (XYZAL) 5 MG tablet Take 5 mg by mouth every evening. 06/18/21   [provider]  oxyCODONE-acetaminophen (PERCOCET) 5-325 MG tablet Take 1 tablet by mouth  every 4 (four) hours as needed for up to 7 days for severe pain. 08/09/21 08/16/21  Carole Civil, MD  pravastatin (PRAVACHOL) 40 MG tablet Take 40 mg by mouth every evening.    [provider]  promethazine (PHENERGAN) 12.5 MG tablet Take 1 tablet (12.5 mg total) by mouth every 6 (six) hours as needed for nausea or vomiting. 08/09/21   Carole Civil, MD  Tamsulosin HCl (FLOMAX) 0.4 MG CAPS Take 0.4 mg by mouth at bedtime.     [provider]  testosterone cypionate (DEPOTESTOSTERONE CYPIONATE) 200 MG/ML injection Inject 200 mg into the muscle every 28 (twenty-eight) days.  08/28/18   [provider]  valsartan-hydrochlorothiazide (DIOVAN-HCT) 160-12.5 MG tablet Take 1 tablet by mouth in the morning. 07/02/21   [provider]      Allergies    Hydrocodone and Other    Review of Systems   Review of Systems  Constitutional:  Negative for fever.  Respiratory:  Negative for shortness of breath.   Cardiovascular:  Negative for chest pain.  Musculoskeletal:  Positive for arthralgias.  Neurological:  Positive for light-headedness. Negative for headaches.    Physical Exam Updated Vital Signs BP (!) 105/53 (BP Location: Right Arm)   Pulse 85   Temp 99.3 F (37.4 C) (Oral)   Ht 6' (1.829 m)   [facility]  97.1 kg   SpO2 100%   BMI 29.02 kg/m  Physical Exam Vitals and nursing note reviewed.  Constitutional:      Appearance: He is well-developed.  HENT:     Head: Normocephalic and atraumatic.  Cardiovascular:     Rate and Rhythm: Normal rate and regular rhythm.     Pulses:          Posterior tibial pulses are 2+ on the right side.     Heart sounds: Normal heart sounds.  Pulmonary:     Effort: Pulmonary effort is normal.     Breath sounds: Normal breath sounds.  Abdominal:     Palpations: Abdomen is soft.     Tenderness: There is no abdominal tenderness.  Musculoskeletal:     Right knee: Swelling (mild) present. No erythema. Normal range of motion  (it's painful to flex/extend though he is able to do it). No tenderness.     Comments: 2 small incisions from surgery are clean/dry/intact.  Sutures are in place.  No erythema or significant heat noted.  Skin:    General: Skin is warm and dry.     Findings: No erythema.  Neurological:     Mental Status: He is alert.     ED Results / Procedures / Treatments   Labs (all labs ordered are listed, but only abnormal results are displayed) Labs Reviewed  CBC WITH DIFFERENTIAL/PLATELET - Abnormal; Notable for the following components:      Result Value   RBC 3.53 (*)    Hemoglobin 10.0 (*)    HCT 29.4 (*)    All other components within normal limits  COMPREHENSIVE METABOLIC PANEL - Abnormal; Notable for the following components:   Sodium 128 (*)    CO2 17 (*)    Glucose, Bld 132 (*)    BUN 60 (*)    Creatinine, Ser 4.45 (*)    Calcium 8.4 (*)    Albumin 3.4 (*)    GFR, Estimated 14 (*)    All other components within normal limits  URINALYSIS, ROUTINE W REFLEX MICROSCOPIC    EKG None  Radiology No results found.  Procedures Procedures    Medications Ordered in ED Medications  lactated ringers infusion (has no administration in time range)  lactated ringers bolus 1,000 mL (1,000 mLs Intravenous New Bag/Given 08/15/21 1743)  fentaNYL (SUBLIMAZE) injection 50 mcg (50 mcg Intravenous Given 08/15/21 1744)    ED Course/ Medical Decision Making/ A&P                           Medical Decision Making Amount and/or Complexity of Data Reviewed Independent Historian: spouse External Data Reviewed: labs and notes. Labs: ordered. ECG/medicine tests: ordered and independent interpretation performed.  Risk Prescription drug management. Decision regarding hospitalization.   Patient has chronic kidney failure with a creatinine typically in the 3 range.  However today it is up to 4.45.  Mildly hyponatremic and mildly low bicarbonate that likely goes along with this.  I suspect this  is mostly a dehydration issue.  White blood cell count is normal and I have a pretty low suspicion he has an acute infected knee at this point. Temp is 99 but no reported fevers at home. He has no other significant symptoms besides the lightheadedness and weakness when standing.  He was given a bolus of IV fluids but I think he will need more. Discussed with Dr. Waldron Labs for admission.  Final Clinical Impression(s) / ED Diagnoses Final diagnoses:  Acute kidney injury superimposed on chronic kidney disease [facility])    Rx / DC Orders ED Discharge Orders     None         Sherwood Gambler, MD 08/15/21 1939

## 2021-08-15 NOTE — ED Triage Notes (Signed)
Pt had right knee orthoscopy on Friday outpatient, surgery done by Dr. Aline Brochure, wasn't able to fill prescription for pain meds until Monday due to pharmacy out of pain meds, unable to participate in therapy due to pain.

## 2021-08-15 NOTE — Progress Notes (Signed)
Pharmacy Antibiotic Note  Scott Savage is a 66 y.o. male admitted on 08/15/2021 with knee pain and possible wound infection after knee surgery. Pharmacy has been consulted for cefepime dosing. Cr up from baseline. Linezolid ordered per MD.  Plan: Cefepime 2g q24h Follow Cr  Height: 6' (182.9 cm) Weight: 97.1 kg (214 lb) IBW/kg (Calculated) : 77.6  Temp (24hrs), Avg:99.3 F (37.4 C), Min:99.3 F (37.4 C), Max:99.3 F (37.4 C)  Recent Labs  Lab 08/15/21 1723  WBC 8.8  CREATININE 4.45*    Estimated Creatinine Clearance: 19.2 mL/min (A) (by C-G formula based on SCr of 4.45 mg/dL (H)).    Allergies  Allergen Reactions   Hydrocodone Itching and Nausea And Vomiting   Other Nausea And Vomiting    Anesthesia-significant nausea/vomiting      Einar Grad 08/15/2021 7:39 PM

## 2021-08-15 NOTE — H&P (Addendum)
TRH H&P   Patient Demographics:    Scott Savage, is a 66 y.o. male  MRN: [id number]   DOB - 1955/09/16  Admit Date - 08/15/2021  Outpatient Primary MD for the patient is Michell Heinrich, DO  Referring MD/NP/PA: Dr Regenia Skeeter  Outpatient Specialists: ortho Dr Aline Brochure    Patient coming from: home  Chief Complaint  Patient presents with   Post-op Problem      HPI:    Oz Gammel  is a 66 y.o. male, with medical history significant of hypertension, hyperlipidemia, CKD stage IV, hyperlipidemia, BPH, and to ED secondary to right knee pain, generalized weakness, fatigue and generalized body ache, patient had right knee arthroscopy by Dr. Aline Brochure 6/2, reports he has been having pain since 6/3, was a delay in him getting his pain medicine through his pharmacy, does report generalized body ache, chills, weakness and fatigue as well, he does have some difficulty producing his urine as well, he denies chest pain, shortness of breath, cough, any focal deficits. -In ED he had low-grade temperature 99.3, was tachypneic at 1 point at 22, blood pressure on admission 105/53, map of 66, urine analysis still pending, rate still pending as well, work-up significant for elevated creatinine at 4.45 from baseline of 2.9, so [facility] consulted to admit.    Review of systems:    A full 10 point Review of Systems was done, except as stated above, all other Review of Systems were negative.   With Past History of the following :    Past Medical History:  Diagnosis Date   HTN (hypertension)    Hyperlipemia    PONV (postoperative nausea and vomiting)    Urinary complications    Uses flomax to help empty bladder      Past Surgical History:  Procedure Laterality Date   BIOPSY  08/01/2019   Procedure: BIOPSY;  Surgeon: Rogene Houston, MD;  [facility];  Service: Endoscopy;;    COLONOSCOPY N/A 08/01/2019   Procedure: COLONOSCOPY;  Surgeon: Rogene Houston, MD;  [facility];  Service: Endoscopy;  Laterality: N/A;  730   KNEE ARTHROSCOPY WITH LATERAL MENISECTOMY Right 08/09/2021   Procedure: KNEE ARTHROSCOPY WITH LATERAL MENISECTOMY;  Surgeon: Carole Civil, MD;  Location: [facility];  Service: Orthopedics;  Laterality: Right;   KNEE ARTHROSCOPY WITH MEDIAL MENISECTOMY Right 08/09/2021   Procedure: KNEE ARTHROSCOPY WITH MEDIAL MENISECTOMY;  Surgeon: Carole Civil, MD;  Location: [facility];  Service: Orthopedics;  Laterality: Right;   left foot  2011   [facility]   left shoulder  2008   Short Stay in [place]  07/04/2011   Procedure: RESECTION DISTAL CLAVICAL;  Surgeon: Carole Civil, MD;  Location: [facility];  Service: Orthopedics;  Laterality: Right;   SHOULDER OPEN ROTATOR CUFF REPAIR  07/04/2011   Procedure: ROTATOR CUFF REPAIR  SHOULDER OPEN;  Surgeon: Carole Civil, MD;  Location: [facility];  Service: Orthopedics;  Laterality: Right;      Social History:     Social History   Tobacco Use   Smoking status: Never   Smokeless tobacco: Never  Substance Use Topics   Alcohol use: No        Family History :     Family History  Problem Relation Age of Onset   Cancer Mother    Cancer Father    Cancer Sister    Cancer Brother    Arthritis Other    Kidney disease Other       Home Medications:   Prior to Admission medications   Medication Sig Start Date End Date Taking? Authorizing Provider  acetaminophen (TYLENOL) 500 MG tablet Take 1,000 mg by mouth every 6 (six) hours as needed for moderate pain or headache.   Yes [provider]  cholecalciferol (VITAMIN D3) 25 MCG (1000 UNIT) tablet Take 1,000 Units by mouth in the morning.   Yes [provider]  levocetirizine (XYZAL) 5 MG tablet Take 5 mg by mouth every evening. 06/18/21  Yes [provider]   oxyCODONE-acetaminophen (PERCOCET) 5-325 MG tablet Take 1 tablet by mouth every 4 (four) hours as needed for up to 7 days for severe pain. 08/09/21 08/16/21 Yes Carole Civil, MD  pravastatin (PRAVACHOL) 40 MG tablet Take 40 mg by mouth every evening.   Yes [provider]  promethazine (PHENERGAN) 12.5 MG tablet Take 1 tablet (12.5 mg total) by mouth every 6 (six) hours as needed for nausea or vomiting. 08/09/21  Yes Carole Civil, MD  Tamsulosin HCl (FLOMAX) 0.4 MG CAPS Take 0.4 mg by mouth at bedtime.    Yes [provider]  testosterone cypionate (DEPOTESTOSTERONE CYPIONATE) 200 MG/ML injection Inject 200 mg into the muscle every 28 (twenty-eight) days.  08/28/18  Yes [provider]  valsartan-hydrochlorothiazide (DIOVAN-HCT) 160-12.5 MG tablet Take 1 tablet by mouth in the morning. 07/02/21  Yes [provider]     Allergies:     Allergies  Allergen Reactions   Hydrocodone Itching and Nausea And Vomiting   Other Nausea And Vomiting    Anesthesia-significant nausea/vomiting     Physical Exam:   Vitals  Blood pressure (!) 133/97, pulse 95, temperature 99.3 F (37.4 C), temperature source Oral, resp. rate (!) 22, height 6' (1.829 m), weight 97.1 kg, SpO2 97 %.   1. General well developed male, lying in bed, in discomfort due to generalized body ache  2. Normal affect and insight, Not Suicidal or Homicidal, Awake Alert, Oriented X 3.  3. No F.N deficits, ALL C.Nerves Intact, Strength 5/5 all 4 extremities, Sensation intact all 4 extremities, Plantars down going.  4. Ears and Eyes appear Normal, Conjunctivae clear, PERRLA. Moist Oral Mucosa.  5. Supple Neck, No JVD, No cervical lymphadenopathy appriciated, No Carotid Bruits.  6. Symmetrical Chest wall movement, Good air movement bilaterally, CTAB.  7. RRR, No Gallops, Rubs or Murmurs, No Parasternal Heave.  8. Positive Bowel Sounds, Abdomen Soft, No tenderness, No organomegaly  appriciated,No rebound -guarding or rigidity.  9.  No Cyanosis, Normal Skin Turgor, No Skin Rash or Bruise.  10. Good muscle tone,  joints appear normal , no effusions, right knee with swelling, and bruising, sutures still present from recent arthroscopy  11. No Palpable Lymph Nodes in Neck or Axillae   Data Review:    CBC Recent Labs  Lab 08/15/21 1723  WBC 8.8  HGB 10.0*  HCT 29.4*  PLT 224  MCV 83.3  MCH 28.3  MCHC 34.0  RDW 13.1  LYMPHSABS 2.5  MONOABS 0.9  EOSABS 0.0  BASOSABS 0.0   ------------------------------------------------------------------------------------------------------------------  Chemistries  Recent Labs  Lab 08/15/21 1723  NA 128*  K 4.7  CL 99  CO2 17*  GLUCOSE 132*  BUN 60*  CREATININE 4.45*  CALCIUM 8.4*  AST 24  ALT 22  ALKPHOS 98  BILITOT 1.1   ------------------------------------------------------------------------------------------------------------------ estimated creatinine clearance is 19.2 mL/min (A) (by C-G formula based on SCr of 4.45 mg/dL (H)). ------------------------------------------------------------------------------------------------------------------ No results for input(s): "TSH", "T4TOTAL", "T3FREE", "THYROIDAB" in the last 72 hours.  Invalid input(s): "FREET3"  Coagulation profile No results for input(s): "INR", "PROTIME" in the last 168 hours. ------------------------------------------------------------------------------------------------------------------- No results for input(s): "DDIMER" in the last 72 hours. -------------------------------------------------------------------------------------------------------------------  Cardiac Enzymes No results for input(s): "CKMB", "TROPONINI", "MYOGLOBIN" in the last 168 hours.  Invalid input(s): "CK" ------------------------------------------------------------------------------------------------------------------    Component Value Date/Time   BNP 110.0 (H)  06/24/2021 0015     ---------------------------------------------------------------------------------------------------------------  Urinalysis    Component Value Date/Time   COLORURINE YELLOW 06/24/2021 0230   APPEARANCEUR CLEAR 06/24/2021 0230   LABSPEC 1.011 06/24/2021 0230   PHURINE 6.0 06/24/2021 0230   GLUCOSEU NEGATIVE 06/24/2021 0230   HGBUR NEGATIVE 06/24/2021 0230   BILIRUBINUR NEGATIVE 06/24/2021 0230   KETONESUR NEGATIVE 06/24/2021 0230   PROTEINUR 30 (A) 06/24/2021 0230   NITRITE NEGATIVE 06/24/2021 0230   LEUKOCYTESUR NEGATIVE 06/24/2021 0230    ----------------------------------------------------------------------------------------------------------------   Imaging Results:    No results found.  My personal review of EKG: Rhythm NSR, Rate  89 /min, QTc 419   Assessment & Plan:    Principal Problem:   AKI (acute kidney injury) ([place]) Active Problems:   Acute renal failure superimposed on stage 4 chronic kidney disease (HCC)   Primary osteoarthritis of right knee   SIRS (systemic inflammatory response syndrome) (HCC)   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(Wenckebach's) atrioventricular block   AKI in CKD stage IV -Baseline creatinine is around 2.9-3, creatinine elevated at 4.45 on admission, appears to be prerenal with elevated BUN, as he is having generalized illness, feeling weak with poor oral intake -Avoid nephrotoxic medications. -Hold home medication losartan/hydrochlorothiazide -Continue with IV fluids. -Bladder scan was 160 after 250 cc and output, likely low level of urinary retention, continue to monitor closely.  SIRS -Patient presents with temperature, soft blood pressure, tachypnea, complaining of generalized body ache. -Check COVID-19 and flu. -We will send urine cultures and urine analysis.  We will check chest x-ray as well -I will start him empirically on broad-spectrum antibiotic, and this can be de-escalated if his work-up is negative, will start on IV  Zyvox (avoid vancomycin due to renal failure), and cefepime for now  Hyponatremia -This is most likely due to volume depletion, continue with IV fluid and hold hydrochlorothiazide  Anemia of chronic kidney disease -Hemoglobin at baseline  Recent right knee arthroscopy -By Dr. Aline Brochure 6/2, patient supposed to have a follow-up tomorrow, will place consult for him given patient is hospitalized  History of 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(Wenckebach's) atrioventricular block -EKG was reviewed, no significant change from previous   BPH -Continue with Flomax  Hyperlipidemia -Continue with pravastatin       DVT Prophylaxis Heparin   AM Labs Ordered, also please review Full Orders  Family Communication: Admission, patients condition and plan of care including tests being ordered have been discussed with the patient and wife*  who indicate understanding and agree with the plan and Code Status.  Code Status full  Likely DC to  home  Condition GUARDED    Consults called: ortho requested in EPIC    Admission status: observation    Time spent in minutes : 70 minutes   Phillips Climes M.D on 08/15/2021 at 7:46 PM   [facility] - Office  [phone]

## 2021-08-15 NOTE — Therapy (Signed)
OUTPATIENT PHYSICAL THERAPY LOWER EXTREMITY EVALUATION   Patient Name: Scott Savage MRN: [id number] DOB:Sep 07, 1955, 66 y.o., male Today's Date: 08/15/2021   PT End of Session - 08/15/21 1504     Visit Number 1    Number of Visits 12    Date for PT Re-Evaluation 09/26/21    Authorization Type [employer] medicare-visits requested    Progress Note Due on Visit 10    PT Start Time 1600    PT Stop Time 1640    PT Time Calculation (min) 40 min    Activity Tolerance Patient tolerated treatment well    Behavior During Therapy WFL for tasks assessed/performed             Past Medical History:  Diagnosis Date   HTN (hypertension)    Hyperlipemia    PONV (postoperative nausea and vomiting)    Urinary complications    Uses flomax to help empty bladder   Past Surgical History:  Procedure Laterality Date   BIOPSY  08/01/2019   Procedure: BIOPSY;  Surgeon: Rogene Houston, MD;  [facility];  Service: Endoscopy;;   COLONOSCOPY N/A 08/01/2019   Procedure: COLONOSCOPY;  Surgeon: Rogene Houston, MD;  [facility];  Service: Endoscopy;  Laterality: N/A;  730   KNEE ARTHROSCOPY WITH LATERAL MENISECTOMY Right 08/09/2021   Procedure: KNEE ARTHROSCOPY WITH LATERAL MENISECTOMY;  Surgeon: Carole Civil, MD;  Location: [facility];  Service: Orthopedics;  Laterality: Right;   KNEE ARTHROSCOPY WITH MEDIAL MENISECTOMY Right 08/09/2021   Procedure: KNEE ARTHROSCOPY WITH MEDIAL MENISECTOMY;  Surgeon: Carole Civil, MD;  Location: [facility];  Service: Orthopedics;  Laterality: Right;   left foot  2011   [facility]   left shoulder  2008   Short Stay in [place]  07/04/2011   Procedure: RESECTION DISTAL CLAVICAL;  Surgeon: Carole Civil, MD;  Location: [facility];  Service: Orthopedics;  Laterality: Right;   SHOULDER OPEN ROTATOR CUFF REPAIR  07/04/2011   Procedure: ROTATOR CUFF REPAIR SHOULDER OPEN;  Surgeon: Carole Civil, MD;   Location: [facility];  Service: Orthopedics;  Laterality: Right;   Patient Active Problem List   Diagnosis Date Noted   Derangement of posterior horn of medial meniscus of right knee    Meniscus, lateral, derangement, right    Acute bronchitis 06/27/2021   Hyperkalemia 06/27/2021   SIRS (systemic inflammatory response syndrome) (HCC) 06/26/2021   Thrombocytopenia ([place]) 06/26/2021   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[facility]) atrioventricular block    Elevated troponin 06/24/2021   Acute renal failure superimposed on stage 4 chronic kidney disease ([place]) 06/24/2021   Anemia 06/24/2021   Primary osteoarthritis of right knee 09/06/2018   S/P rotator cuff repair 09/25/2011   Rotator cuff tear, right 05/06/2011   Shoulder pain 05/06/2011    PCP: Michell Heinrich  REFERRING PROVIDER: Arther Abbott     REFERRING DIAG: KNEE ARTHROSCOPY WITH LATERAL MENISECTOMY  KNEE ARTHROSCOPY WITH MEDIAL MENISECTOMY  THERAPY DIAG:  Stiffness of right knee, not elsewhere classified  Muscle weakness (generalized)  Difficulty in walking, not elsewhere classified  Rationale for Evaluation and Treatment Rehabilitation  ONSET DATE: 08/09/2021  SUBJECTIVE:   SUBJECTIVE STATEMENT: Pt states that he had arthroscopic surgery on 08/09/2021.  He states that the only thing he has been able to do since the surgery is the CPM, he has done no exercises.  He states that he was walking without a cane or walker prior the surgery  but currently he can not even walk to the bathroom.  The pt is constantly groaning, shaking and moaning.   PERTINENT HISTORY: OA, Rt rotator cuff tear   PAIN:  Are you having pain? Yes: NPRS scale: 10/10 Pain location: knee  Pain description: rt  Aggravating factors: standing  Relieving factors: weight bearing   PRECAUTIONS: falls   WEIGHT BEARING RESTRICTIONS  WBAT  FALLS:  Has patient fallen in last 6 months? No  LIVING ENVIRONMENT: Lives with: lives with with their spouse Lives in:  House/apartment Stairs: no  Has following equipment at home: Environmental consultant - 2 wheeled  OCCUPATION: retired   PLOF: Independent  PATIENT GOALS :  less pain, improved mobility, improved strength and to be able to walk without pain.    OBJECTIVE:   DIAGNOSTIC FINDINGS: IMPRESSION: (pre surgery) 1. 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 2. Diffuse complex tearing of the posterior horn of the medial meniscus with near absence of meniscal tissue. 3. Oblique undersurface tear within the anterior horn of the lateral meniscus with additional horizontal tear extending through the anterior wall into a tiny anterior parameniscal cyst. 4. Moderate joint effusion. 5. Small Baker's cyst containing low signal tiny loose bodies. Additional possibly connected fluid seen deep and lateral to the popliteal vessels, also containing small loose bodies. 6. Mild chronic proximal patellar tendinosis with likely small tiny midsubstance partial-thickness tear. 7. Loose body measuring up to 24 mm within the lateral suprapatellar joint space.  PATIENT SURVEYS:  FOTO .5;   COGNITION:  Overall cognitive status: Within functional limits for tasks assessed       EDEMA:  Minimal    LOWER EXTREMITY ROM:  Active ROM Right eval Left eval  Hip flexion    Hip extension    Hip abduction    Hip adduction    Hip internal rotation    Hip external rotation    Knee flexion    Knee extension -7 30  Ankle dorsiflexion    Ankle plantarflexion    Ankle inversion    Ankle eversion     (Blank rows = not tested)  LOWER EXTREMITY MMT:  MMT Right eval Left eval  Hip flexion    Hip extension    Hip abduction    Hip adduction    Hip internal rotation    Hip external rotation    Knee flexion    Knee extension 3-   Ankle dorsiflexion    Ankle plantarflexion    Ankle inversion    Ankle eversion     (Blank rows = not tested)   FUNCTIONAL TESTS:  Sit  to stand:  Mod to max assist Transfers:  max assist  1 minute walk test: able to stand and walk 4 ft when he states he feels dizzy     TODAY'S TREATMENT: 08/15/2021:  Evaluation ankle pumps.                   PATIENT EDUCATION:  Education details: HEP  Person educated: Restaurant manager, fast food: Consulting civil engineer, Verbal cues, and Handouts Education comprehension: verbalized understanding  HOME EXERCISE PROGRAM: [id number]             Ankle pumps LAQ Quad set Rt heel slide SLR   ASSESSMENT:  CLINICAL IMPRESSION: Patient is a 66 y.o. Male  who was seen today for physical therapy evaluation and treatment for s/p medial and lateral menisectomy of his Rt LE.  Evaluation demonstrates significant decreased activity tolerance, decreased ROM,  decreased strength, increased pain and an inability to walk.  Mr Mcbryar is moaning, groaning and shaking throughout the session.  He is first stating that his knee is hurting then it is his head.  Therapist explained to pt wife that this is not normal at all and therapist feels that he should be taken to the emergency room.  Both pt and his wife agree.  Once discharged from the hospital  Mr. Drummonds will benefit from skilled PT to address the above deficits and return him to maximal functional ability.    OBJECTIVE IMPAIRMENTS Abnormal gait, decreased activity tolerance, decreased balance, difficulty walking, decreased ROM, decreased strength, increased edema, and pain.   ACTIVITY LIMITATIONS carrying, lifting, sitting, standing, squatting, stairs, and locomotion level  PARTICIPATION LIMITATIONS: cleaning, driving, shopping, community activity, and yard work  [place] are also affecting patient's functional outcome.   REHAB POTENTIAL: Good  CLINICAL DECISION MAKING: Unstable/unpredictable  EVALUATION COMPLEXITY: Low   GOALS: Goals reviewed with patient? Yes  SHORT TERM GOALS: Target date: 09/05/2021  PT to be I in HEP to decrease  pain to no greater than a 5/10 Baseline: Goal status: INITIAL  2.  PT Rt knee ROM to be at least 3-95 to allow pt to be comfortable sitting for a half hour. Baseline:  Goal status: INITIAL  3.  PT to be able to come sit to stand with UE  with mod I  Baseline:  Goal status: INITIAL  4.  PT to be able to transfer with mod I Baseline:  Goal status: INITIAL  5.  PT to be able to ambulate with least assistive device for five minutes  Baseline:  Goal status: INITIAL    LONG TERM GOALS: Target date: 09/26/2021   PT to be I in advanced HEP to allow pain to decrease to no greater than a 2/10 Baseline:  Goal status: INITIAL  2.  Pt Rt LE ROM to be 0-120 to allow pt to be able to squat down to pick an item off the floor.  Baseline:  Goal status: INITIAL  3.  Pt strength in Rt LE to be increased to at least 4+/5 to be able to go up and down steps in a reciprocal manner.  Baseline:  Goal status: INITIAL  4.  Pt to be able to ambulate for over 30 minutes without difficulty with least assistive device  Baseline:  Goal status: INITIAL   PLAN: PT FREQUENCY: 3x/week  PT DURATION: 6 weeks  PLANNED INTERVENTIONS: Therapeutic exercises, Balance training, Gait training, Patient/Family education, Cryotherapy, and Manual therapy  PLAN FOR NEXT SESSION: Review HEP and make sure pt is doing correctly, mm test if able, begin weight bearing exercises  to include heel raises, squats, standing knee flexion, standing terminal extension, rockerboard. Continue to progress ROM    Rayetta Humphrey, PT CLT [phone]  08/15/2021

## 2021-08-16 ENCOUNTER — Encounter: Payer: Medicare PPO | Admitting: Orthopedic Surgery

## 2021-08-16 ENCOUNTER — Inpatient Hospital Stay (HOSPITAL_COMMUNITY): Payer: Medicare PPO

## 2021-08-16 ENCOUNTER — Telehealth: Payer: Self-pay | Admitting: Orthopedic Surgery

## 2021-08-16 ENCOUNTER — Encounter (HOSPITAL_COMMUNITY): Payer: Self-pay | Admitting: Internal Medicine

## 2021-08-16 DIAGNOSIS — Z20822 Contact with and (suspected) exposure to covid-19: Secondary | ICD-10-CM | POA: Diagnosis present

## 2021-08-16 DIAGNOSIS — I129 Hypertensive chronic kidney disease with stage 1 through stage 4 chronic kidney disease, or unspecified chronic kidney disease: Secondary | ICD-10-CM | POA: Diagnosis present

## 2021-08-16 DIAGNOSIS — D631 Anemia in chronic kidney disease: Secondary | ICD-10-CM | POA: Diagnosis present

## 2021-08-16 DIAGNOSIS — N400 Enlarged prostate without lower urinary tract symptoms: Secondary | ICD-10-CM

## 2021-08-16 DIAGNOSIS — N184 Chronic kidney disease, stage 4 (severe): Secondary | ICD-10-CM | POA: Diagnosis present

## 2021-08-16 DIAGNOSIS — E871 Hypo-osmolality and hyponatremia: Secondary | ICD-10-CM | POA: Diagnosis present

## 2021-08-16 DIAGNOSIS — E785 Hyperlipidemia, unspecified: Secondary | ICD-10-CM

## 2021-08-16 DIAGNOSIS — D696 Thrombocytopenia, unspecified: Secondary | ICD-10-CM

## 2021-08-16 DIAGNOSIS — E875 Hyperkalemia: Secondary | ICD-10-CM | POA: Diagnosis present

## 2021-08-16 DIAGNOSIS — Z79899 Other long term (current) drug therapy: Secondary | ICD-10-CM | POA: Diagnosis not present

## 2021-08-16 DIAGNOSIS — N179 Acute kidney failure, unspecified: Secondary | ICD-10-CM | POA: Diagnosis present

## 2021-08-16 DIAGNOSIS — I441 Atrioventricular block, second degree: Secondary | ICD-10-CM | POA: Diagnosis present

## 2021-08-16 DIAGNOSIS — N401 Enlarged prostate with lower urinary tract symptoms: Secondary | ICD-10-CM | POA: Diagnosis present

## 2021-08-16 DIAGNOSIS — M1712 Unilateral primary osteoarthritis, left knee: Secondary | ICD-10-CM | POA: Diagnosis present

## 2021-08-16 DIAGNOSIS — M25462 Effusion, left knee: Secondary | ICD-10-CM | POA: Diagnosis present

## 2021-08-16 DIAGNOSIS — M1711 Unilateral primary osteoarthritis, right knee: Secondary | ICD-10-CM | POA: Diagnosis present

## 2021-08-16 DIAGNOSIS — R338 Other retention of urine: Secondary | ICD-10-CM | POA: Diagnosis present

## 2021-08-16 DIAGNOSIS — R6510 Systemic inflammatory response syndrome (SIRS) of non-infectious origin without acute organ dysfunction: Secondary | ICD-10-CM | POA: Diagnosis present

## 2021-08-16 DIAGNOSIS — E860 Dehydration: Secondary | ICD-10-CM | POA: Diagnosis present

## 2021-08-16 DIAGNOSIS — M109 Gout, unspecified: Secondary | ICD-10-CM | POA: Diagnosis present

## 2021-08-16 DIAGNOSIS — Z885 Allergy status to narcotic agent status: Secondary | ICD-10-CM | POA: Diagnosis not present

## 2021-08-16 LAB — CBC
HCT: 26.8 % — ABNORMAL LOW (ref 39.0–52.0)
Hemoglobin: 8.8 g/dL — ABNORMAL LOW (ref 13.0–17.0)
MCH: 28.2 pg (ref 26.0–34.0)
MCHC: 32.8 g/dL (ref 30.0–36.0)
MCV: 85.9 fL (ref 80.0–100.0)
Platelets: 185 10*3/uL (ref 150–400)
RBC: 3.12 MIL/uL — ABNORMAL LOW (ref 4.22–5.81)
RDW: 13 % (ref 11.5–15.5)
WBC: 6.7 10*3/uL (ref 4.0–10.5)
nRBC: 0 % (ref 0.0–0.2)

## 2021-08-16 LAB — COMPREHENSIVE METABOLIC PANEL
ALT: 20 U/L (ref 0–44)
AST: 21 U/L (ref 15–41)
Albumin: 3 g/dL — ABNORMAL LOW (ref 3.5–5.0)
Alkaline Phosphatase: 97 U/L (ref 38–126)
Anion gap: 8 (ref 5–15)
BUN: 61 mg/dL — ABNORMAL HIGH (ref 8–23)
CO2: 20 mmol/L — ABNORMAL LOW (ref 22–32)
Calcium: 8.5 mg/dL — ABNORMAL LOW (ref 8.9–10.3)
Chloride: 106 mmol/L (ref 98–111)
Creatinine, Ser: 4.16 mg/dL — ABNORMAL HIGH (ref 0.61–1.24)
GFR, Estimated: 15 mL/min — ABNORMAL LOW (ref >60)
Glucose, Bld: 116 mg/dL — ABNORMAL HIGH (ref 70–99)
Potassium: 4.7 mmol/L (ref 3.5–5.1)
Sodium: 134 mmol/L — ABNORMAL LOW (ref 135–145)
Total Bilirubin: 0.7 mg/dL (ref 0.3–1.2)
Total Protein: 6.7 g/dL (ref 6.5–8.1)

## 2021-08-16 LAB — MRSA NEXT GEN BY PCR, NASAL: MRSA by PCR Next Gen: NOT DETECTED

## 2021-08-16 MED ORDER — OXYCODONE HCL 5 MG PO TABS
10.0000 mg | ORAL_TABLET | ORAL | Status: DC
  Administered 2021-08-16 – 2021-08-19 (×16): 10 mg via ORAL
  Filled 2021-08-16 (×17): qty 2

## 2021-08-16 MED ORDER — DOCUSATE SODIUM 100 MG PO CAPS
100.0000 mg | ORAL_CAPSULE | Freq: Two times a day (BID) | ORAL | Status: DC
  Administered 2021-08-16 – 2021-08-19 (×5): 100 mg via ORAL
  Filled 2021-08-16 (×6): qty 1

## 2021-08-16 MED ORDER — DEXAMETHASONE SODIUM PHOSPHATE 10 MG/ML IJ SOLN
10.0000 mg | Freq: Once | INTRAMUSCULAR | Status: AC
  Administered 2021-08-16: 10 mg via INTRAVENOUS
  Filled 2021-08-16: qty 1

## 2021-08-16 MED ORDER — POLYETHYLENE GLYCOL 3350 17 G PO PACK
17.0000 g | PACK | Freq: Every day | ORAL | Status: DC
  Administered 2021-08-16 – 2021-08-19 (×4): 17 g via ORAL
  Filled 2021-08-16 (×4): qty 1

## 2021-08-16 NOTE — Assessment & Plan Note (Addendum)
-  In the setting of dehydration and decreased oral intake -Continue to follow electrolytes trend -Sodium level within normal limits (137) at time of discharge.

## 2021-08-16 NOTE — Progress Notes (Signed)
Progress Note   Patient: Scott Savage UMP:[id number] DOB: [birth year] DOA: 08/15/2021     0 DOS: the patient was seen and examined on 08/16/2021   Brief hospital course: As per H&P written by Dr. Waldron Labs on 08/15/2021  Scott Savage  is a 66 y.o. male, with medical history significant of hypertension, hyperlipidemia, CKD stage IV, hyperlipidemia, BPH, and to ED secondary to right knee pain, generalized weakness, fatigue and generalized body ache, patient had right knee arthroscopy by Dr. Aline Brochure 6/2, reports he has been having pain since 6/3, was a delay in him getting his pain medicine through his pharmacy, does report generalized body ache, chills, weakness and fatigue as well, he does have some difficulty producing his urine as well, he denies chest pain, shortness of breath, cough and any focal deficits. -In ED he had low-grade temperature 99.3, was tachypneic at 22, blood pressure on admission 105/53, map of 66, urine analysis still pending, rate still pending as well, work-up significant for elevated creatinine at 4.45 from baseline of 2.9, so [facility] hospitalist consulted to admit.  Assessment and Plan: Acute renal failure superimposed on stage 4 chronic kidney disease ([place]) - In the setting of prerenal azotemia continue use of nephrotoxic agents -Provide fluid resuscitation, minimize/avoid nephrotoxic agents as much as possible. -Maintain adequate hydration and follow renal function trend. -Avoid the use of contrast and avoid hypotension. -No signs of UTI appreciated on UA.  Thrombocytopenia (HCC) -Chronic and appears to be stable -No overt bleeding appreciated -Continue to follow platelets count trend.  BPH (benign prostatic hyperplasia) - Continue the use of Flomax.  Hyperlipidemia - Continue Pravachol.  Hyponatremia -In the setting of dehydration and decreased oral intake -Continue to follow electrolytes trend -Continue IV fluid resuscitation and supportive care.  Mobitz  (type) I (Wenckebach's) atrioventricular block -Appears to be stable and at baseline -Patient reports no chest pain or palpitations.  SIRS (systemic inflammatory response syndrome) (HCC) -No frank source of infection isolated; empirically treated with antibiotics-continue IV fluids and supportive care -Follow culture results.  Primary osteoarthritis of right knee -Status post arthroscopy by Dr. Aline Brochure -We will follow any further recommendation -Physical therapy has evaluated patient and is recommending nursing home for rehabilitation due to ongoing deconditioning/limitation performing ADL's.    Subjective:  Currently afebrile; reports no chest pain, no nausea or vomiting.  Decreased appetite and feeling weak/deconditioned.  Complaining of pain on his right knee.  Physical Exam: Vitals:   08/16/21 0600 08/16/21 0700 08/16/21 0743 08/16/21 0756  BP: 109/84 130/66  133/73  Pulse: 94 82  93  Resp: 16   18  Temp:   98.7 F (37.1 C) 99.4 F (37.4 C)  TempSrc:   Oral Oral  SpO2: 99%   100%  Weight:      Height:       General exam: Alert, awake, oriented x 3; no chest pain, no nausea, no vomiting.  Complaining of right knee pain.  Decreased appetite and feeling weak and tired. Respiratory system: Clear to auscultation. Respiratory effort normal.  Good saturation on room air. Cardiovascular system:RRR. No murmurs, rubs, gallops.  No JVD. Gastrointestinal system: Abdomen is nondistended, soft and nontender. No organomegaly or masses felt. Normal bowel sounds heard. Central nervous system: Alert and oriented. No focal neurological deficits. Extremities: No cyanosis or clubbing; right knee status postarthroscopy with sutures in place.  Reports to be tender on palpation.  No redness appreciated. Skin: No petechiae. Psychiatry: Judgement and insight appear normal. Mood & affect appropriate.  Data Reviewed: Comprehensive metabolic panel demonstrating sodium 134, potassium 4.7, chloride  106, BUN 16, creatinine 4.16, AST 21 ALT 20, GFR 15 and anion gap 8. CBC: Hemoglobin 8.8, WBC 6.7, platelet count 185 K.  Family Communication: Wife at bedside.  Disposition: Status is: Inpatient Remains inpatient appropriate because: Still requiring IV fluids and treatment for acute on chronic renal failure.  Patient also demonstrating significant physical deconditioning and will require placement in a skilled nursing facility for rehabilitation.   Planned Discharge Destination: Skilled nursing facility   Author: Barton Dubois, MD 08/16/2021 3:06 PM  For on call review [email or web address].

## 2021-08-16 NOTE — ED Notes (Signed)
Pt given urinal.

## 2021-08-16 NOTE — NC FL2 (Addendum)
[place] LEVEL OF CARE SCREENING TOOL     IDENTIFICATION  Patient Name: Scott Savage Birthdate: 1955/04/07 Sex: male Admission Date (Current Location): 08/15/2021  [facility] and [place] Number:  [employer] and Address:  [place] [address], [place]      Provider Number: [id number]  Attending Physician Name and Address:  Barton Dubois, MD  Relative Name and Phone Number:  Jameil, Whitmoyer (Spouse)   [phone]    Current Level of Care: Hospital Recommended Level of Care: [place] Prior Approval Number:    Date Approved/Denied:   PASRR Number:  [id number]  Discharge Plan: SNF    Current Diagnoses: Patient Active Problem List   Diagnosis Date Noted   Acute on chronic renal failure ([place]) 08/16/2021   AKI (acute kidney injury) ([place]) 08/15/2021   Derangement of posterior horn of medial meniscus of right knee    Meniscus, lateral, derangement, right    Acute bronchitis 06/27/2021   Hyperkalemia 06/27/2021   SIRS (systemic inflammatory response syndrome) ([place]) 06/26/2021   Thrombocytopenia ([place]) 06/26/2021   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[facility]) atrioventricular block    Elevated troponin 06/24/2021   Acute renal failure superimposed on stage 4 chronic kidney disease ([place]) 06/24/2021   Anemia 06/24/2021   Primary osteoarthritis of right knee 09/06/2018   S/P rotator cuff repair 09/25/2011   Rotator cuff tear, right 05/06/2011   Shoulder pain 05/06/2011    Orientation RESPIRATION BLADDER Height & Weight     Self, Time, Situation, Place  Normal Continent Weight: 214 lb (97.1 kg) Height:  6' (182.9 cm)  BEHAVIORAL SYMPTOMS/MOOD NEUROLOGICAL BOWEL NUTRITION STATUS      Continent Diet (heart healthy)  AMBULATORY STATUS COMMUNICATION OF NEEDS Skin   Limited Assist Verbally Surgical wounds (right knee)                       Personal Care Assistance Level of Assistance  Bathing, Feeding,  Dressing Bathing Assistance: Limited assistance Feeding assistance: Independent Dressing Assistance: Limited assistance     Functional Limitations Info  Sight, Hearing, Speech Sight Info: Adequate Hearing Info: Adequate Speech Info: Adequate    SPECIAL CARE FACTORS FREQUENCY  PT (By licensed PT), OT (By licensed OT)     PT Frequency: 5x/week OT Frequency: 3x/week            Contractures Contractures Info: Not present    Additional Factors Info  Code Status, Allergies Code Status Info: Full Code Allergies Info: Hydrocodone, anesthesia           Current Medications (08/16/2021):  This is the current hospital active medication list Current Facility-Administered Medications  Medication Dose Route Frequency Provider Last Rate Last Admin   0.9 %  sodium chloride infusion   Intravenous Continuous Elgergawy, Silver Huguenin, MD 75 mL/hr at 08/16/21 0527 Rate Verify at 08/16/21 0527   ceFEPIme (MAXIPIME) 2 g in sodium chloride 0.9 % 100 mL IVPB  2 g Intravenous Q24H Einar Grad, [facility]   Stopped at 08/15/21 2057   heparin injection 5,000 Units  5,000 Units Subcutaneous Q8H Elgergawy, Silver Huguenin, MD   5,000 Units at 08/16/21 1329   HYDROmorphone (DILAUDID) injection 0.5 mg  0.5 mg Intravenous Q4H PRN Elgergawy, Silver Huguenin, MD   0.5 mg at 08/16/21 0758   linezolid (ZYVOX) IVPB 600 mg  600 mg Intravenous Q12H Elgergawy, Silver Huguenin, MD 300 mL/hr at 08/16/21 1102 600 mg at 08/16/21 1102   ondansetron (  ZOFRAN) tablet 4 mg  4 mg Oral Q6H PRN Elgergawy, Silver Huguenin, MD   4 mg at 08/16/21 0447   Or   ondansetron (ZOFRAN) injection 4 mg  4 mg Intravenous Q6H PRN Elgergawy, Silver Huguenin, MD   4 mg at 08/16/21 1135   oxyCODONE-acetaminophen (PERCOCET/ROXICET) 5-325 MG per tablet 1 tablet  1 tablet Oral Q4H PRN Elgergawy, Silver Huguenin, MD   1 tablet at 08/16/21 1135   pravastatin (PRAVACHOL) tablet 40 mg  40 mg Oral QPM Einar Grad, RPH   40 mg at 08/15/21 2034   tamsulosin (FLOMAX) capsule 0.4 mg  0.4 mg  Oral QHS Elgergawy, Silver Huguenin, MD   0.4 mg at 08/15/21 2153     Discharge Medications: Please see discharge summary for a list of discharge medications.  Relevant Imaging Results:  Relevant Lab Results:   Additional Information SSN [id number]. Per wife, patient is vaccinated for COVID.  Scott Savage, Clydene Pugh, LCSW

## 2021-08-16 NOTE — Assessment & Plan Note (Signed)
-   In the setting of prerenal azotemia continue use of nephrotoxic agents -Provide fluid resuscitation, minimize/avoid nephrotoxic agents as much as possible. -Maintain adequate hydration and follow renal function trend. -Avoid the use of contrast and avoid hypotension. -No signs of UTI appreciated on UA.

## 2021-08-16 NOTE — Assessment & Plan Note (Addendum)
-  Status post arthroscopy by Dr. Aline Brochure -Will follow any further recommendation; concern for gouty/pseudogout compromising integrity on patient's left knee. -Received Decadron x2 days as per service recommendation. -Patient reports feeling much better from bilateral knee pain. -Patient has opted to go home with home health PT and outpatient follow-up with orthopedic service at discharge.

## 2021-08-16 NOTE — Assessment & Plan Note (Addendum)
-  No frank source of infection isolated; empirically treated with antibiotics-continue IV fluids and supportive care -Follow culture results.

## 2021-08-16 NOTE — Assessment & Plan Note (Addendum)
-  Appears to be stable and at baseline -Patient reports no chest pain or palpitations. -No abnormalities appreciated on telemetry.

## 2021-08-16 NOTE — Telephone Encounter (Signed)
No, he has clinic today he is not at hospital, will forward to him so he is aware He will not be here in office because he had severe pain and went to ER

## 2021-08-16 NOTE — Evaluation (Signed)
Occupational Therapy Evaluation Patient Details Name: Scott Savage MRN: [id number] DOB: 11-Feb-1956 Today's Date: 08/16/2021   History of Present Illness Scott Savage  is a 66 y.o. male, with medical history significant of hypertension, hyperlipidemia, CKD stage IV, hyperlipidemia, BPH, and to ED secondary to right knee pain, generalized weakness, fatigue and generalized body ache, patient had right knee arthroscopy by Dr. Aline Brochure 6/2, reports he has been having pain since 6/3, was a delay in him getting his pain medicine through his pharmacy, does report generalized body ache, chills, weakness and fatigue as well, he does have some difficulty producing his urine as well, he denies chest pain, shortness of breath, cough, any focal deficits. (Per MD)   Clinical Impression   Pt agreeable to OT and PT co-evaluation. Pt presents with 9/10 pain while supine in bed. During bed mobility and standing pt moaned and grimaced indicating severe pain. Max A +2 needed for sit to stand from EOB and chair today. Once standing more Mod to Max A needed. Pt generally weak and limited by significant pain which was reported worse so in L knee to foot region. Pt was left in chair with call bell within reach. Pt will benefit from continued OT in the hospital and recommended venue below to increase strength, balance, and endurance for safe ADL's.         Recommendations for follow up therapy are one component of a multi-disciplinary discharge planning process, led by the attending physician.  Recommendations may be updated based on patient status, additional functional criteria and insurance authorization.   Follow Up Recommendations  Skilled nursing-short term rehab (<3 hours/day)    Assistance Recommended at Discharge Intermittent Supervision/Assistance  Patient can return home with the following Two people to help with walking and/or transfers;A lot of help with bathing/dressing/bathroom;Assistance with  cooking/housework;Assist for transportation;Help with stairs or ramp for entrance    Functional Status Assessment  Patient has had a recent decline in their functional status and demonstrates the ability to make significant improvements in function in a reasonable and predictable amount of time.  Equipment Recommendations  None recommended by OT    Recommendations for Other Services       Precautions / Restrictions Precautions Precautions: Fall Restrictions Weight Bearing Restrictions: No      Mobility Bed Mobility Overal bed mobility: Needs Assistance Bed Mobility: Supine to Sit     Supine to sit: HOB elevated, Min guard, Min assist     General bed mobility comments: very slow labored movement with signs of increased pain    Transfers Overall transfer level: Needs assistance Equipment used: Rolling walker (2 wheels) Transfers: Sit to/from Stand, Bed to chair/wheelchair/BSC Sit to Stand: Max assist, +2 physical assistance     Step pivot transfers: Mod assist, Max assist     General transfer comment: Severe pain in standing needing much assist to boost from bed. Once standing with RW less assist needed but slow labored movement with sings of much pain.      Balance Overall balance assessment: Needs assistance Sitting-balance support: Feet supported, Bilateral upper extremity supported Sitting balance-Leahy Scale: Fair Sitting balance - Comments: fair to good seated EOB   Standing balance support: During functional activity, Bilateral upper extremity supported, Reliant on assistive device for balance                               ADL either performed or assessed with clinical judgement  ADL Overall ADL's : Needs assistance/impaired     Grooming: Set up;Sitting   Upper Body Bathing: Set up;Sitting   Lower Body Bathing: Maximal assistance;Sitting/lateral leans   Upper Body Dressing : Set up;Sitting   Lower Body Dressing: Maximal  assistance;Sitting/lateral leans Lower Body Dressing Details (indicate cue type and reason): Max A to don socks at [employer] Transfer: Maximal assistance;+2 for physical assistance Toilet Transfer Details (indicate cue type and reason): simualted via EOB to chair Toileting- Clothing Manipulation and Hygiene: Min guard;Minimal assistance;Sitting/lateral lean         General ADL Comments: Limited by severe pain.     Vision Baseline Vision/History: 1 Wears glasses Ability to See in Adequate Light: 1 Impaired Patient Visual Report: No change from baseline Vision Assessment?: No apparent visual deficits     Perception     Praxis      Pertinent Vitals/Pain Pain Assessment Pain Assessment: 0-10 Pain Score: 9  Pain Location: "legs" (More severe in L knee to foot) Pain Descriptors / Indicators: Other (Comment) (excruciating) Pain Intervention(s): Limited activity within patient's tolerance, Monitored during session, Repositioned, Premedicated before session     Hand Dominance Right   Extremity/Trunk Assessment Upper Extremity Assessment Upper Extremity Assessment: Generalized weakness   Lower Extremity Assessment Lower Extremity Assessment: Defer to PT evaluation   Cervical / Trunk Assessment Cervical / Trunk Assessment: Normal   Communication Communication Communication: No difficulties   Cognition Arousal/Alertness: Awake/alert Behavior During Therapy: WFL for tasks assessed/performed Overall Cognitive Status: Within Functional Limits for tasks assessed                                                        Home Living Family/patient expects to be discharged to:: Private residence Living Arrangements: Spouse/significant other Available Help at Discharge: Family;Available PRN/intermittently Type of Home: House Home Access: Level entry     Home Layout: One level     Bathroom Shower/Tub: Teacher, early years/pre: Handicapped  height (toilet riser) Bathroom Accessibility: Yes How Accessible: Accessible via walker Home Equipment: [place] (2 wheels);Shower seat;Grab bars - tub/shower (CPM for knee)          Prior Functioning/Environment Prior Level of Function : Independent/Modified Independent             Mobility Comments: community ambulation with RW ADLs Comments: Independent using RW        OT Problem List: Decreased strength;Decreased activity tolerance;Impaired balance (sitting and/or standing);Pain      OT Treatment/Interventions: Self-care/ADL training;Therapeutic exercise;DME and/or AE instruction;Therapeutic activities;Patient/family education;Balance training    OT Goals(Current goals can be found in the care plan section) Acute Rehab OT Goals Patient Stated Goal: return home OT Goal Formulation: With patient Time For Goal Achievement: 08/30/21 Potential to Achieve Goals: Fair  OT Frequency: Min 2X/week    Co-evaluation PT/OT/SLP Co-Evaluation/Treatment: Yes  Reason for co-treat: to address functional ADL transfers.   OT goals addressed during session: ADL's and self-care                       End of Session Equipment Utilized During Treatment: Rolling walker (2 wheels)  Activity Tolerance: Patient tolerated treatment well Patient left: in chair;with call bell/phone within reach  OT Visit Diagnosis: Unsteadiness on feet (R26.81);Muscle weakness (generalized) (M62.81);Pain Pain - Right/Left:  (bilateral)  Pain - part of body:  (legs)                Time: 1164-3539 OT Time Calculation (min): 20 min Charges:  OT General Charges $OT Visit: 1 Visit OT Evaluation $OT Eval Moderate Complexity: 1 Mod  Laia Wiley OT, MOT  [employer] 08/16/2021, 9:00 AM

## 2021-08-16 NOTE — Telephone Encounter (Signed)
Call received from patient's wife Scott Savage, designated contact, relaying that patient went to [facility] Emergency room last night following out patient therapy. Relays he was in a lot of pain and 'could not walk'; therefore, therapist advised going on to the emergency room. Patient is scheduled for appointment today, status/post right knee surgery 6.2.23.  Per Scott Savage, there was not a room available so patient may still be in the E.R. States she is going there to be with him soon, and asked if Dr Aline Brochure was at the hospital by chance. Please advise.

## 2021-08-16 NOTE — ED Notes (Signed)
Pt c/o nausea, neck, and leg pain.

## 2021-08-16 NOTE — Assessment & Plan Note (Signed)
-  Continue Pravachol ?

## 2021-08-16 NOTE — Plan of Care (Signed)
  Problem: Acute Rehab OT Goals (only OT should resolve) Goal: Pt. Will Perform Grooming Flowsheets (Taken 08/16/2021 0903) Pt Will Perform Grooming:  standing  with min assist Goal: Pt. Will Perform Lower Body Bathing Flowsheets (Taken 08/16/2021 0903) Pt Will Perform Lower Body Bathing:  with min assist  sitting/lateral leans  with adaptive equipment Goal: Pt. Will Perform Lower Body Dressing Flowsheets (Taken 08/16/2021 0903) Pt Will Perform Lower Body Dressing:  with min assist  sitting/lateral leans  with adaptive equipment  with min guard assist Goal: Pt. Will Transfer To Toilet [place] (Taken 08/16/2021 [phone]) Pt Will Transfer to Toilet:  with min assist  stand pivot transfer Goal: Pt/Caregiver Will Perform Home Exercise Program Flowsheets (Taken 08/16/2021 0903) Pt/caregiver will Perform Home Exercise Program:  Increased strength  Both right and left upper extremity  Independently  Nylan Nevel OT, MOT

## 2021-08-16 NOTE — Assessment & Plan Note (Signed)
-  Continue the use of Flomax. -Patient denies complaints of urinary retention

## 2021-08-16 NOTE — Progress Notes (Signed)
Patient ID: Scott Savage, male   DOB: 02-Apr-1955, 66 y.o.   MRN: [id number]  [name] 66 year old male with chronic renal failure had a routine knee arthroscopy last Friday, June 2 presented to the ER on June 8 with acute renal failure severe left knee pain on top of his postop right knee pain.  Apparently he was unloading his right knee onto his left knee and started having pain and swelling where he could not weight-bear.  He went to physical therapy could not participate was sent to the ER and found to be in acute renal failure  Examination of the left knee shows a small joint effusion with tenderness to mild touch suggesting possible gout or pseudogout  Right knee has a small postop effusion suture line is intact on both portals with no signs of infection      Latest Ref Rng & Units 08/16/2021    3:40 AM 08/15/2021    5:23 PM 08/07/2021    3:00 PM  CBC  WBC 4.0 - 10.5 K/uL 6.7  8.8  6.6   Hemoglobin 13.0 - 17.0 g/dL 8.8  10.0  10.5   Hematocrit 39.0 - 52.0 % 26.8  29.4  31.3   Platelets 150 - 400 K/uL 185  224  191       Latest Ref Rng & Units 08/16/2021    3:40 AM 08/15/2021    5:23 PM 08/07/2021    3:24 PM  BMP  Glucose 70 - 99 mg/dL 116  132    BUN 8 - 23 mg/dL 61  60    Creatinine 0.61 - 1.24 mg/dL 4.16  4.45    Sodium 135 - 145 mmol/L 134  128    Potassium 3.5 - 5.1 mmol/L 4.7  4.7    Chloride 98 - 111 mmol/L 106  99    CO2 22 - 32 mmol/L 20  17    Calcium 8.9 - 10.3 mg/dL 8.5  8.4  8.6    Recommend x-ray left knee  I will try to adjust his pain medication  I think a dose of dexamethasone or Solu-Medrol would help as well.

## 2021-08-16 NOTE — Evaluation (Signed)
Physical Therapy Evaluation Patient Details Name: Scott Savage MRN: [id number] DOB: [birth year] Today's Date: 08/16/2021  History of Present Illness  Scott Savage  is a 66 y.o. male, with medical history significant of hypertension, hyperlipidemia, CKD stage IV, hyperlipidemia, BPH, and to ED secondary to right knee pain, generalized weakness, fatigue and generalized body ache, patient had right knee arthroscopy by Dr. Aline Brochure 6/2, reports he has been having pain since 6/3, was a delay in him getting his pain medicine through his pharmacy, does report generalized body ache, chills, weakness and fatigue as well, he does have some difficulty producing his urine as well, he denies chest pain, shortness of breath, cough, any focal deficits.   Clinical Impression  Patient presents supine in bed with nursing staff pre-medicated patient for pain. Patient consents to PT evaluation. Patient is min assist/guard with bed mobility requiring extended time with slow labored movement secondary to pain in L LE and generalized weakness. Decreased trunk control also observed. Patient is +2 max assist with sit to stand transfer with RW. Patient demonstrates difficulty with initiating transfer due to high levels of pain in L LE and decreased coordination with struggling to weight bear on L LE and balance overall. Mod/max assist needed with bed to chair transfer once patient standing but continues slow labored movement with difficulty weight shifting and balancing overall. Verbal cues applied to assist in transfer. Patient limited to a few steps in room with ambulation with mod/max assist needed with similar presentation of sx as described above limiting patient along with overall fatigue. Patient tolerated sitting up in chair after therapy. Patient will benefit from continued skilled physical therapy in hospital and recommended venue below to increase strength, balance, endurance for safe ADLs and gait.       Recommendations for follow up therapy are one component of a multi-disciplinary discharge planning process, led by the attending physician.  Recommendations may be updated based on patient status, additional functional criteria and insurance authorization.  Follow Up Recommendations Skilled nursing-short term rehab (<3 hours/day)    Assistance Recommended at Discharge Set up Supervision/Assistance  Patient can return home with the following  Help with stairs or ramp for entrance;Two people to help with walking and/or transfers;A little help with bathing/dressing/bathroom;Assist for transportation;A lot of help with bathing/dressing/bathroom    Equipment Recommendations None recommended by PT  Recommendations for Other Services       Functional Status Assessment Patient has had a recent decline in their functional status and demonstrates the ability to make significant improvements in function in a reasonable and predictable amount of time.     Precautions / Restrictions Precautions Precautions: Fall Restrictions Weight Bearing Restrictions: No      Mobility  Bed Mobility Overal bed mobility: Needs Assistance Bed Mobility: Supine to Sit     Supine to sit: HOB elevated, Min assist, Min guard     General bed mobility comments: Very slow and cautious labored movement with high levels of pain in L LE. Needs increased time to complete task and generalized weakness and decreased trunk control observed    Transfers Overall transfer level: Needs assistance Equipment used: Rolling walker (2 wheels) Transfers: Sit to/from Stand, Bed to chair/wheelchair/BSC Sit to Stand: Max assist, +2 physical assistance   Step pivot transfers: Mod assist, Max assist       General transfer comment: Max assist with +2 physical assistance with STS transfer. Struggles with locking knees out and initating standing due to weakness and high levels of pain.  Less assist needed with bed to chair  transfer once standing, but demonstrates very slow labored movement and difficulty weight bearing on L LE due to high levels of pain and discomfort.    Ambulation/Gait Ambulation/Gait assistance: Mod assist, Max assist Gait Distance (Feet): 4 Feet Assistive device: Rolling walker (2 wheels) Gait Pattern/deviations: Narrow base of support, Trunk flexed, Decreased step length - right, Decreased step length - left, Decreased stride length, Decreased weight shift to left, Knee flexed in stance - right Gait velocity: decreased     General Gait Details: Limited to few steps in room with RW and mod/max assist. Difficulty with coordination and balance due to pain with L LE weight bearing impacting overall balance  Stairs            Wheelchair Mobility    Modified Rankin (Stroke Patients Only)       Balance Overall balance assessment: Needs assistance Sitting-balance support: Feet supported, Bilateral upper extremity supported Sitting balance-Leahy Scale: Fair Sitting balance - Comments: Fair balance sitting EOB with UE support but lacks trunk stability to maintain upright when reaching outside BOS   Standing balance support: During functional activity, Bilateral upper extremity supported, Reliant on assistive device for balance Standing balance-Leahy Scale: Poor Standing balance comment: Reliant on PT and RW for balance due to difficulty weight shifting and standing upright with legs underneath for support                             Pertinent Vitals/Pain Pain Assessment Pain Assessment: 0-10 Pain Score: 9  Pain Location: "legs" Pain Descriptors / Indicators: Other (Comment) Pain Intervention(s): Limited activity within patient's tolerance, Monitored during session, Repositioned, Premedicated before session    [place] expects to be discharged to:: Private residence Living Arrangements: Spouse/significant other Available Help at Discharge:  Family;Available PRN/intermittently Type of Home: House Home Access: Level entry       Home Layout: One level Home Equipment: Conservation officer, nature (2 wheels);Shower seat;Grab bars - tub/shower      Prior Function Prior Level of Function : Independent/Modified Independent             Mobility Comments: community ambulation with RW used intermittently ADLs Comments: Independent using RW     Hand Dominance   Dominant Hand: Right    Extremity/Trunk Assessment   Upper Extremity Assessment Upper Extremity Assessment: Defer to OT evaluation    Lower Extremity Assessment Lower Extremity Assessment: Generalized weakness;LLE deficits/detail;RLE deficits/detail RLE Deficits / Details: General weakness s/p R knee arthoscopy with lateral and medial menisectomy. LLE Deficits / Details: Demonstrates high amount of pain and discomfort when moving L LE during bed mobility and transfers    Cervical / Trunk Assessment Cervical / Trunk Assessment: Normal  Communication   Communication: No difficulties  Cognition Arousal/Alertness: Awake/alert Behavior During Therapy: WFL for tasks assessed/performed Overall Cognitive Status: Within Functional Limits for tasks assessed                                          General Comments      Exercises     Assessment/Plan    PT Assessment Patient needs continued PT services;All further PT needs can be met in the next venue of care  PT Problem List Decreased strength;Decreased activity tolerance;Decreased balance;Decreased mobility;Decreased coordination;Pain       PT Treatment Interventions  DME instruction;Gait training;Functional mobility training;Therapeutic activities;Therapeutic exercise;Balance training    PT Goals (Current goals can be found in the Care Plan section)  Acute Rehab PT Goals Patient Stated Goal: return home PT Goal Formulation: With patient Time For Goal Achievement: 08/16/21 Potential to Achieve  Goals: Fair    Frequency Min 3X/week     Co-evaluation PT/OT/SLP Co-Evaluation/Treatment: Yes Reason for Co-Treatment: To address functional/ADL transfers PT goals addressed during session: Mobility/safety with mobility;Balance;Proper use of DME OT goals addressed during session: ADL's and self-care       AM-PAC PT "6 Clicks" Mobility  Outcome Measure Help needed turning from your back to your side while in a flat bed without using bedrails?: A Little Help needed moving from lying on your back to sitting on the side of a flat bed without using bedrails?: A Little Help needed moving to and from a bed to a chair (including a wheelchair)?: A Lot Help needed standing up from a chair using your arms (e.g., wheelchair or bedside chair)?: Total Help needed to walk in hospital room?: A Lot Help needed climbing 3-5 steps with a railing? : Total 6 Click Score: 12    End of Session Equipment Utilized During Treatment: Gait belt Activity Tolerance: Patient tolerated treatment well;Patient limited by pain;Patient limited by fatigue Patient left: in chair;with call bell/phone within reach Nurse Communication: Mobility status PT Visit Diagnosis: Unsteadiness on feet (R26.81);Other abnormalities of gait and mobility (R26.89);Muscle weakness (generalized) (M62.81)    Time: 5374-8270 PT Time Calculation (min) (ACUTE ONLY): 30 min   Charges:   PT Evaluation $PT Eval Moderate Complexity: 1 Mod PT Treatments $Therapeutic Activity: 23-37 mins        10:49 AM, 08/16/21 Lestine Box, S/PT

## 2021-08-16 NOTE — Plan of Care (Signed)
  Problem: Acute Rehab PT Goals(only PT should resolve) Goal: Pt Will Go Supine/Side To Sit Outcome: Progressing Flowsheets (Taken 08/16/2021 1050) Pt will go Supine/Side to Sit:  with min guard assist  with minimal assist Goal: Patient Will Transfer Sit To/From Stand Outcome: Progressing Flowsheets (Taken 08/16/2021 1050) Patient will transfer sit to/from stand:  with moderate assist  with maximum assist Goal: Pt Will Transfer Bed To Chair/Chair To Bed Outcome: Progressing Flowsheets (Taken 08/16/2021 1050) Pt will Transfer Bed to Chair/Chair to Bed:  with mod assist  with max assist Goal: Pt Will Perform Standing Balance Or Pre-Gait Outcome: Progressing Flowsheets (Taken 08/16/2021 1050) Pt will perform standing balance or pre-gait:  with bilateral UE support  1-2 min  with minimal assist  with moderate assist Goal: Pt Will Ambulate Outcome: Progressing Flowsheets (Taken 08/16/2021 1050) Pt will Ambulate:  50 feet  with rolling walker  with moderate assist  with maximum assist   10:51 AM, 08/16/21 Lestine Box, S/PT

## 2021-08-16 NOTE — Assessment & Plan Note (Addendum)
-  Chronic and appears to be stable -No overt bleeding appreciated -Continue to follow platelets count trend.

## 2021-08-16 NOTE — TOC Initial Note (Addendum)
Transition of Care [facility]) - Initial/Assessment Note    Patient Details  Name: Scott Savage MRN: [id number] Date of Birth: 1955/08/16  [facility]) CM/SW Contact:    Ihor Gully, LCSW Phone Number: 08/16/2021, 11:30 AM  Clinical Narrative:                 Update: Patient wants to go to SNF. Referral sent to requested SNFs. Patient from home with spouse. Admitted for knee pain/AKI. Has standard walker, BSC, crutches, not currently driving. He is not doing a lot at home. PT recommends SNF. Discussed PT evaluation and recommendation with wife who was at bedside. Patient and wife want to discuss discharge plan. TOC to speak with family again in an hour to get decision.   Expected Discharge Plan: [place] Barriers to Discharge: Continued Medical Work up   Patient Goals and CMS Choice Patient states their goals for this hospitalization and ongoing recovery are:: considering d/c plan      Expected Discharge Plan and Services Expected Discharge Plan: [place]       Living arrangements for the past 2 months: Single Family Home                                      Prior Living Arrangements/Services Living arrangements for the past 2 months: Single Family Home Lives with:: Spouse Patient language and need for interpreter reviewed:: Yes        Need for Family Participation in Patient Care: Yes (Comment) Care giver support system in place?: Yes (comment) Current home services: DME Criminal Activity/Legal Involvement Pertinent to Current Situation/Hospitalization: No - Comment as needed  Activities of Daily Living Home Assistive Devices/Equipment: Eyeglasses ADL Screening (condition at time of admission) Patient's cognitive ability adequate to safely complete daily activities?: Yes Is the patient deaf or have difficulty hearing?: No Does the patient have difficulty seeing, even when wearing glasses/contacts?: Yes Does  the patient have difficulty concentrating, remembering, or making decisions?: No Patient able to express need for assistance with ADLs?: Yes Does the patient have difficulty dressing or bathing?: No Independently performs ADLs?: No Communication: Needs assistance Is this a change from baseline?: Pre-admission baseline Dressing (OT): Needs assistance Is this a change from baseline?: Pre-admission baseline Grooming: Needs assistance Is this a change from baseline?: Pre-admission baseline Feeding: Independent Bathing: Needs assistance Is this a change from baseline?: Pre-admission baseline Toileting: Independent In/Out Bed: Needs assistance Is this a change from baseline?: Pre-admission baseline Walks in Home: Needs assistance Is this a change from baseline?: Pre-admission baseline Does the patient have difficulty walking or climbing stairs?: Yes Weakness of Legs: Both Weakness of Arms/Hands: None  Permission Sought/Granted Permission sought to share information with : Family Supports    Share Information with NAME: wife, Candace           Emotional Assessment     Affect (typically observed): Appropriate Orientation: : Oriented to Self, Oriented to Place, Oriented to  Time, Oriented to Situation Alcohol / Substance Use: Not Applicable    Admission diagnosis:  AKI (acute kidney injury) ([place]) [N17.9] Acute kidney injury superimposed on chronic kidney disease ([place]) [N17.9, N18.9] Acute on chronic renal failure ([place]) [N17.9, N18.9] Patient Active Problem List   Diagnosis Date Noted   Acute on chronic renal failure ([place]) 08/16/2021   AKI (acute kidney injury) ([place]) 08/15/2021   Derangement  of posterior horn of medial meniscus of right knee    Meniscus, lateral, derangement, right    Acute bronchitis 06/27/2021   Hyperkalemia 06/27/2021   SIRS (systemic inflammatory response syndrome) ([place]) 06/26/2021   Thrombocytopenia ([place]) 06/26/2021   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

## 2021-08-17 DIAGNOSIS — E785 Hyperlipidemia, unspecified: Secondary | ICD-10-CM | POA: Diagnosis not present

## 2021-08-17 DIAGNOSIS — N179 Acute kidney failure, unspecified: Secondary | ICD-10-CM | POA: Diagnosis not present

## 2021-08-17 DIAGNOSIS — N400 Enlarged prostate without lower urinary tract symptoms: Secondary | ICD-10-CM | POA: Diagnosis not present

## 2021-08-17 DIAGNOSIS — E871 Hypo-osmolality and hyponatremia: Secondary | ICD-10-CM | POA: Diagnosis not present

## 2021-08-17 LAB — BASIC METABOLIC PANEL
Anion gap: 7 (ref 5–15)
BUN: 67 mg/dL — ABNORMAL HIGH (ref 8–23)
CO2: 18 mmol/L — ABNORMAL LOW (ref 22–32)
Calcium: 8.5 mg/dL — ABNORMAL LOW (ref 8.9–10.3)
Chloride: 107 mmol/L (ref 98–111)
Creatinine, Ser: 4.08 mg/dL — ABNORMAL HIGH (ref 0.61–1.24)
GFR, Estimated: 15 mL/min — ABNORMAL LOW (ref >60)
Glucose, Bld: 174 mg/dL — ABNORMAL HIGH (ref 70–99)
Potassium: 5.3 mmol/L — ABNORMAL HIGH (ref 3.5–5.1)
Sodium: 132 mmol/L — ABNORMAL LOW (ref 135–145)

## 2021-08-17 MED ORDER — DEXAMETHASONE SODIUM PHOSPHATE 10 MG/ML IJ SOLN
10.0000 mg | Freq: Once | INTRAMUSCULAR | Status: AC
  Administered 2021-08-17: 10 mg via INTRAVENOUS
  Filled 2021-08-17: qty 1

## 2021-08-17 MED ORDER — ALUM & MAG HYDROXIDE-SIMETH 200-200-20 MG/5ML PO SUSP
15.0000 mL | ORAL | Status: DC | PRN
  Administered 2021-08-17 – 2021-08-18 (×2): 15 mL via ORAL
  Filled 2021-08-17 (×2): qty 30

## 2021-08-17 NOTE — Progress Notes (Signed)
Patient ID: Scott Savage, male   DOB: 12-Feb-1956, 66 y.o.   MRN: [id number]  Arthroscopy right knee June 2  Left knee pain acute associated with acute on chronic renal failure.  Patient responded well to increased oxycodone and IV dexamethasone  Left knee is much better today Effusion is less All less palpable tenderness Imaging showed osteoarthritis left knee no effusion was really detected but clinically he does have an effusion  Right knee patient can bend well no issues there sutures can come out today.  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

## 2021-08-17 NOTE — Assessment & Plan Note (Addendum)
-  In the setting of acute on chronic renal failure -Very mild elevation on 08/17/2021; K 5.0 today; -Will continue to follow electrolytes trend.

## 2021-08-17 NOTE — Progress Notes (Signed)
Progress Note   Patient: EDDRICK DILONE QHU:[id number] DOB: 11/29/1955 DOA: 08/15/2021     1 DOS: the patient was seen and examined on 08/17/2021   Brief hospital course: As per H&P written by Dr. Waldron Labs on 08/15/2021  Scott Savage  is a 66 y.o. male, with medical history significant of hypertension, hyperlipidemia, CKD stage IV, hyperlipidemia, BPH, and to ED secondary to right knee pain, generalized weakness, fatigue and generalized body ache, patient had right knee arthroscopy by Dr. Aline Brochure 6/2, reports he has been having pain since 6/3, was a delay in him getting his pain medicine through his pharmacy, does report generalized body ache, chills, weakness and fatigue as well, he does have some difficulty producing his urine as well, he denies chest pain, shortness of breath, cough and any focal deficits. -In ED he had low-grade temperature 99.3, was tachypneic at 22, blood pressure on admission 105/53, map of 66, urine analysis still pending, rate still pending as well, work-up significant for elevated creatinine at 4.45 from baseline of 2.9, so [facility] hospitalist consulted to admit.  Assessment and Plan: Acute renal failure superimposed on stage 4 chronic kidney disease ([place]) - In the setting of prerenal azotemia continue use of nephrotoxic agents -Creatinine improving and patient reported increasing urine output. -Continue to maintain adequate hydration and follow renal function trend. -Avoid the use of contrast and avoid hypotension. -No signs of UTI appreciated on UA. -follow Urine output and clinical response.  Thrombocytopenia ([facility]) -Chronic and appears to be stable -No overt bleeding appreciated -Continue to follow platelets count trend.  BPH (benign prostatic hyperplasia) -Continue the use of Flomax. -Patient denies complaints of urinary retention  Hyperlipidemia - Continue Pravachol.  Hyponatremia -In the setting of dehydration and decreased oral intake -Continue to  follow electrolytes trend -Continue IV fluid resuscitation and supportive care.  Hyperkalemia -In the setting of acute on chronic renal failure -Very mild elevation; K 5.3 -Will repeat lipids in the morning and treat with Lokelma if needed.  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(Wenckebach's) atrioventricular block -Appears to be stable and at baseline -Patient reports no chest pain or palpitations. -Continue telemetry monitoring for another 24 hours.  SIRS (systemic inflammatory response syndrome) (HCC) -No frank source of infection isolated -Antibiotics has been discontinued -Will follow clinical response and Follow culture results.  Primary osteoarthritis of right knee -Status post arthroscopy by Dr. Aline Brochure -Will follow any further recommendation; concern for gouty/pseudogout compromising integrity on patient's left knee.  Continue treatment with Decadron for now. -Patient reports feeling better from bilateral knee pain. -Physical therapy has evaluated patient and is recommending nursing home for rehabilitation due to ongoing deconditioning/limitation performing ADL's.    Subjective:  No fever, no chest pain, no nausea, no vomiting.  Reports feeling slightly better today.  Still weak, deconditioned and expressing some decrease in his appetite.  Physical Exam: Vitals:   08/16/21 1505 08/16/21 1612 08/17/21 0000 08/17/21 0516  BP: 139/65 (!) 142/56 125/68 137/63  Pulse: 82 72 67 67  Resp: '17 19  17  '$ Temp: 98.5 F (36.9 C) 98.8 F (37.1 C) 97.8 F (36.6 C) 97.7 F (36.5 C)  TempSrc:  Oral Oral Oral  SpO2: 99% 100% 98% 100%  Weight:      Height:      General exam: Alert, awake, oriented x 3; no chest pain, no nausea, no vomiting.  Reports feeling better today with some improvement in his left knee discomfort while still having some pain in his right knee. Respiratory system: Clear  to auscultation. Respiratory effort normal.  Good saturation on room air; no using accessory  muscle. Cardiovascular system:RRR. No murmurs, rubs, gallops.  No JVD. Gastrointestinal system: Abdomen is nondistended, soft and nontender. No organomegaly or masses felt. Normal bowel sounds heard. Central nervous system: Alert and oriented. No focal neurological deficits. Extremities: No cyanosis or clubbing.  Left knee tender to palpation with mild swelling (improved); right knee with sutures from recent arthroscopy in place no having supination or signs of infection. Skin: No petechiae. Psychiatry: Judgement and insight appear normal. Mood & affect appropriate.   Data Reviewed: Basic metabolic panel: Demonstrating sodium 132, potassium 5.3, bicarb 18, glucose 174, BUN 67 and creatinine 4.08  Family Communication: No family at bedside.  Disposition: Status is: Inpatient Remains inpatient appropriate because: Still requiring IV fluids and treatment for acute on chronic renal failure.    Patient also demonstrating significant physical deconditioning and will require placement in a skilled nursing facility for rehabilitation.   Planned Discharge Destination: Skilled nursing facility   Author: Barton Dubois, MD 08/17/2021 2:54 PM  For on call review [email or web address].

## 2021-08-18 DIAGNOSIS — N400 Enlarged prostate without lower urinary tract symptoms: Secondary | ICD-10-CM | POA: Diagnosis not present

## 2021-08-18 DIAGNOSIS — E875 Hyperkalemia: Secondary | ICD-10-CM

## 2021-08-18 DIAGNOSIS — E785 Hyperlipidemia, unspecified: Secondary | ICD-10-CM | POA: Diagnosis not present

## 2021-08-18 DIAGNOSIS — N179 Acute kidney failure, unspecified: Secondary | ICD-10-CM | POA: Diagnosis not present

## 2021-08-18 LAB — BASIC METABOLIC PANEL
Anion gap: 9 (ref 5–15)
BUN: 67 mg/dL — ABNORMAL HIGH (ref 8–23)
CO2: 17 mmol/L — ABNORMAL LOW (ref 22–32)
Calcium: 8.8 mg/dL — ABNORMAL LOW (ref 8.9–10.3)
Chloride: 111 mmol/L (ref 98–111)
Creatinine, Ser: 3.32 mg/dL — ABNORMAL HIGH (ref 0.61–1.24)
GFR, Estimated: 19 mL/min — ABNORMAL LOW (ref >60)
Glucose, Bld: 143 mg/dL — ABNORMAL HIGH (ref 70–99)
Potassium: 5 mmol/L (ref 3.5–5.1)
Sodium: 137 mmol/L (ref 135–145)

## 2021-08-18 MED ORDER — PANTOPRAZOLE SODIUM 40 MG PO TBEC
40.0000 mg | DELAYED_RELEASE_TABLET | Freq: Every day | ORAL | Status: DC
  Administered 2021-08-18 – 2021-08-19 (×2): 40 mg via ORAL
  Filled 2021-08-18 (×2): qty 1

## 2021-08-18 MED ORDER — DEXAMETHASONE SODIUM PHOSPHATE 10 MG/ML IJ SOLN
10.0000 mg | Freq: Once | INTRAMUSCULAR | Status: AC
Start: 2021-08-18 — End: 2021-08-18
  Administered 2021-08-18: 10 mg via INTRAVENOUS
  Filled 2021-08-18: qty 1

## 2021-08-18 NOTE — Progress Notes (Addendum)
Progress Note   Patient: Scott Savage UVO:[id number] DOB: 1955/11/28 DOA: 08/15/2021     2 DOS: the patient was seen and examined on 08/18/2021   Brief hospital course: As per H&P written by Dr. Waldron Labs on 08/15/2021  Jacai Kipp  is a 66 y.o. male, with medical history significant of hypertension, hyperlipidemia, CKD stage IV, hyperlipidemia, BPH, and to ED secondary to right knee pain, generalized weakness, fatigue and generalized body ache, patient had right knee arthroscopy by Dr. Aline Brochure 6/2, reports he has been having pain since 6/3, was a delay in him getting his pain medicine through his pharmacy, does report generalized body ache, chills, weakness and fatigue as well, he does have some difficulty producing his urine as well, he denies chest pain, shortness of breath, cough and any focal deficits. -In ED he had low-grade temperature 99.3, was tachypneic at 22, blood pressure on admission 105/53, map of 66, urine analysis still pending, rate still pending as well, work-up significant for elevated creatinine at 4.45 from baseline of 2.9, so [facility] hospitalist consulted to admit.  Assessment and Plan: Acute renal failure superimposed on stage 4 chronic kidney disease ([place]) - In the setting of prerenal azotemia continue use of nephrotoxic agents -Creatinine improving and patient reported increasing urine output. -Continue to maintain adequate hydration and follow renal function trend. -Creatinine reaching baseline range; currently 3.32. -Avoid the use of contrast and avoid hypotension. -No signs of UTI appreciated on UA. -Continue to follow Urine output and clinical response.  Thrombocytopenia (HCC) -Chronic and appears to be stable -No overt bleeding appreciated -Continue to follow platelets count trend.  BPH (benign prostatic hyperplasia) -Continue the use of Flomax. -Patient denies complaints of urinary retention  Hyperlipidemia - Continue Pravachol.  Hyponatremia -In  the setting of dehydration and decreased oral intake -Continue to follow electrolytes trend -Continue IV fluid resuscitation and supportive care.  Hyperkalemia -In the setting of acute on chronic renal failure -Very mild elevation on 08/17/2021; K 5.0 today; -Will continue to follow electrolytes trend.  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(Wenckebach's) atrioventricular block -Appears to be stable and at baseline -Patient reports no chest pain or palpitations. -Continue telemetry monitoring for another 24 hours.  SIRS (systemic inflammatory response syndrome) (HCC) -No frank source of infection isolated -Antibiotics has been discontinued -Will follow clinical response and Follow culture results.  Primary osteoarthritis of right knee -Status post arthroscopy by Dr. Aline Brochure -Will follow any further recommendation; concern for gouty/pseudogout compromising integrity on patient's left knee.  Continue treatment with Decadron for now. -Patient reports feeling better from bilateral knee pain. -Physical therapy has evaluated patient and is recommending nursing home for rehabilitation due to ongoing deconditioning/limitation performing ADL's.    Subjective:  No fever, no chest pain, no nausea, no vomiting.  Patient reports appetite is improving and is feeling much better today.  Decreased pain and swelling on his left knee reported.  Expressed improving his urine output.  Physical Exam: Vitals:   08/17/21 0516 08/17/21 2331 08/18/21 0546 08/18/21 1304  BP: 137/63 (!) 174/78 (!) 158/78 (!) 141/75  Pulse: 67 83 72 94  Resp: 17   20  Temp: 97.7 F (36.5 C) 98.3 F (36.8 C) (!) 97.3 F (36.3 C) 98.1 F (36.7 C)  TempSrc: Oral Oral Oral Oral  SpO2: 100% 98% 98% 93%  Weight:      Height:       General exam: Alert, awake, oriented x 3, reports feeling significantly better; still having some pain in his knees bilateral (  but much improved).  Express he feel he can go home and follow instructions for knee  movements as dictated by orthopedic service.  Patient is afebrile and reports appetite is improving. Respiratory system: Clear to auscultation. Respiratory effort normal.  No using accessory muscle.  Good saturation on room air. Cardiovascular system:RRR. No murmurs, rubs, gallops.  No JVD. Gastrointestinal system: Abdomen is nondistended, soft and nontender. No organomegaly or masses felt. Normal bowel sounds heard. Central nervous system: Alert and oriented. No focal neurological deficits. Extremities: No cyanosis or clubbing.  Reports improvement in the discomfort of both knees; significant decrease in the swelling of his left knee appreciated. Skin: No petechiae; sutures from recent arthroscopy in right knee appreciated; no drainage.  Psychiatry: Judgement and insight appear normal. Mood & affect appropriate.   Data Reviewed: Basic metabolic panel: Sodium 628, potassium 5.0, chloride 111, bicarb 17, BUN 67 and creatinine 3.32.  Family Communication: No family at bedside.  Disposition: Status is: Inpatient Remains inpatient appropriate because: Still requiring IV fluids and treatment for acute on chronic renal failure.    Patient also demonstrating significant physical deconditioning and will require placement in a skilled nursing facility for rehabilitation.   Planned Discharge Destination: Skilled nursing facility   Author: Barton Dubois, MD 08/18/2021 3:48 PM  For on call review [email or web address].

## 2021-08-18 NOTE — Progress Notes (Signed)
Patient slept what he could during the night his  main complaint was he had indigestion. Mylanta and Zofran was given with little relief. No other complications during the shift. Pain being managed with scheduled Oxycodone.

## 2021-08-18 NOTE — Progress Notes (Signed)
Patient ID: Scott Savage, male   DOB: May 22, 1955, 66 y.o.   MRN: [id number]  POD 9 RIGHT KNEE ARHTROSCOPY  LEFT KNEE GOUT V PSEUDO GOUT   IMPROVED AFTER 2 IN DOSES OF DEXAMETHASONE  REC AROM KNEE BENDS 10 X EVERY 2 HRS AND WBAT   FU WITH ME 6/19

## 2021-08-19 ENCOUNTER — Telehealth: Payer: Self-pay | Admitting: Orthopedic Surgery

## 2021-08-19 ENCOUNTER — Ambulatory Visit (HOSPITAL_COMMUNITY): Payer: Medicare PPO | Admitting: Physical Therapy

## 2021-08-19 DIAGNOSIS — N179 Acute kidney failure, unspecified: Secondary | ICD-10-CM | POA: Diagnosis not present

## 2021-08-19 DIAGNOSIS — M1711 Unilateral primary osteoarthritis, right knee: Secondary | ICD-10-CM

## 2021-08-19 DIAGNOSIS — N400 Enlarged prostate without lower urinary tract symptoms: Secondary | ICD-10-CM | POA: Diagnosis not present

## 2021-08-19 DIAGNOSIS — E785 Hyperlipidemia, unspecified: Secondary | ICD-10-CM | POA: Diagnosis not present

## 2021-08-19 DIAGNOSIS — E875 Hyperkalemia: Secondary | ICD-10-CM | POA: Diagnosis not present

## 2021-08-19 LAB — BASIC METABOLIC PANEL
Anion gap: 4 — ABNORMAL LOW (ref 5–15)
BUN: 61 mg/dL — ABNORMAL HIGH (ref 8–23)
CO2: 20 mmol/L — ABNORMAL LOW (ref 22–32)
Calcium: 8.6 mg/dL — ABNORMAL LOW (ref 8.9–10.3)
Chloride: 113 mmol/L — ABNORMAL HIGH (ref 98–111)
Creatinine, Ser: 3.15 mg/dL — ABNORMAL HIGH (ref 0.61–1.24)
GFR, Estimated: 21 mL/min — ABNORMAL LOW (ref >60)
Glucose, Bld: 123 mg/dL — ABNORMAL HIGH (ref 70–99)
Potassium: 5 mmol/L (ref 3.5–5.1)
Sodium: 137 mmol/L (ref 135–145)

## 2021-08-19 MED ORDER — OXYCODONE-ACETAMINOPHEN 5-325 MG PO TABS
1.0000 | ORAL_TABLET | Freq: Four times a day (QID) | ORAL | 0 refills | Status: DC | PRN
Start: 2021-08-19 — End: 2021-08-26

## 2021-08-19 MED ORDER — PANTOPRAZOLE SODIUM 40 MG PO TBEC: 40.0000 mg | DELAYED_RELEASE_TABLET | Freq: Every day | ORAL | 1 refills | Status: AC

## 2021-08-19 MED ORDER — VALSARTAN-HYDROCHLOROTHIAZIDE 160-12.5 MG PO TABS: 1.0000 | ORAL_TABLET | Freq: Every morning | ORAL | Status: AC

## 2021-08-19 NOTE — Progress Notes (Signed)
Physical Therapy Treatment Patient Details Name: Scott Savage MRN: [id number] DOB: 28-Oct-1955 Today's Date: 08/19/2021   History of Present Illness Scott Savage  is a 66 y.o. male, with medical history significant of hypertension, hyperlipidemia, CKD stage IV, hyperlipidemia, BPH, and to ED secondary to right knee pain, generalized weakness, fatigue and generalized body ache, patient had right knee arthroscopy by Dr. Aline Brochure 6/2, reports he has been having pain since 6/3, was a delay in him getting his pain medicine through his pharmacy, does report generalized body ache, chills, weakness and fatigue as well, he does have some difficulty producing his urine as well, he denies chest pain, shortness of breath, cough, any focal deficits.    PT Comments    Patient presents sitting at bedside and consents to PT treatment. Patient was able to tolerate therapeutic exercises demonstrating improved strength and endurance. Patient is mod/max assist with sit to stand transfer from EOB with reliance on PT for stability while needing extended time to gain full knee extension for upright position. Patient is mod assist with transfer from chair with ability to use UE for push off from arm rest with decreased assistance needed to complete task. Patient able to ambulate 50 feet in hallway with min guard and RW.Improved weight shifting and coordination ability but still presents with balance deficits. Decreased gait speed observed with B LE pain and fatigue limiting patient. Patient tolerated sitting up in chair after therapy with nursing staff notified of mobility status. Patient will benefit from continued skilled physical therapy in hospital and recommended venue below to increase strength, balance, endurance for safe ADLs and gait.    Recommendations for follow up therapy are one component of a multi-disciplinary discharge planning process, led by the attending physician.  Recommendations may be updated based  on patient status, additional functional criteria and insurance authorization.  Follow Up Recommendations  Skilled nursing-short term rehab (<3 hours/day)     Assistance Recommended at Discharge Set up Supervision/Assistance  Patient can return home with the following Help with stairs or ramp for entrance;Two people to help with walking and/or transfers;A little help with bathing/dressing/bathroom;Assist for transportation;A lot of help with bathing/dressing/bathroom   Equipment Recommendations  None recommended by PT    Recommendations for Other Services       Precautions / Restrictions Precautions Precautions: Fall Restrictions Weight Bearing Restrictions: No     Mobility  Bed Mobility                    Transfers Overall transfer level: Needs assistance Equipment used: Rolling walker (2 wheels) Transfers: Sit to/from Stand, Bed to chair/wheelchair/BSC Sit to Stand: Mod assist, Max assist   Step pivot transfers: Mod assist       General transfer comment: Patient is mod/max assist with sit to stand from bed with STS transfer. Struggles with locking out knees with PT providing majority of stability while patient takes time to lock out knees. Less pain associated with transfer with main limitation of general weakness. Patient is min/mod assist with STS from chair with ability to use UE's to provide assistance. Mild labored movement still observed with improved weight bearing ability bilaterally.    Ambulation/Gait Ambulation/Gait assistance: Min guard Gait Distance (Feet): 50 Feet Assistive device: Rolling walker (2 wheels) Gait Pattern/deviations: Narrow base of support, Trunk flexed, Decreased step length - right, Decreased step length - left, Decreased stride length, Decreased weight shift to left, Knee flexed in stance - right Gait velocity: decreased  General Gait Details: Patient able to ambulate for 50 feet in hallway with RW and min guard provided by  PT. Improved weight shifting and coordination with minor balance deficits still present. Decreased gait speed still present with pain and fatigue limitations.   Stairs             Wheelchair Mobility    Modified Rankin (Stroke Patients Only)       Balance Overall balance assessment: Needs assistance Sitting-balance support: Feet supported, Bilateral upper extremity supported Sitting balance-Leahy Scale: Good Sitting balance - Comments: Trunk stability improved with patient able to sit EOB with UE support in uprigth position   Standing balance support: During functional activity, Bilateral upper extremity supported, Reliant on assistive device for balance Standing balance-Leahy Scale: Fair Standing balance comment: Reliant on RW for balance but improved ability to maintain upright position in standing without LE's buckling due to fatigue and pain                            Cognition Arousal/Alertness: Awake/alert Behavior During Therapy: WFL for tasks assessed/performed Overall Cognitive Status: Within Functional Limits for tasks assessed                                          Exercises General Exercises - Lower Extremity Long Arc Quad: AROM, 15 reps, Strengthening, Seated, Both Hip Flexion/Marching: AROM, 15 reps, Strengthening, Both, Seated Toe Raises: AROM, 20 reps, Strengthening, Seated, Both Heel Raises: AROM, 20 reps, Strengthening, Both, Seated    General Comments        Pertinent Vitals/Pain Pain Assessment Pain Assessment: No/denies pain    Home Living                          Prior Function            PT Goals (current goals can now be found in the care plan section) Acute Rehab PT Goals Patient Stated Goal: return home PT Goal Formulation: With patient Time For Goal Achievement: 08/23/21 Potential to Achieve Goals: Good Progress towards PT goals: Progressing toward goals    Frequency    Min  3X/week      PT Plan Current plan remains appropriate    Co-evaluation              AM-PAC PT "6 Clicks" Mobility   Outcome Measure  Help needed turning from your back to your side while in a flat bed without using bedrails?: A Little Help needed moving from lying on your back to sitting on the side of a flat bed without using bedrails?: A Little Help needed moving to and from a bed to a chair (including a wheelchair)?: A Lot Help needed standing up from a chair using your arms (e.g., wheelchair or bedside chair)?: A Lot Help needed to walk in hospital room?: A Little Help needed climbing 3-5 steps with a railing? : A Lot 6 Click Score: 15    End of Session Equipment Utilized During Treatment: Gait belt Activity Tolerance: Patient tolerated treatment well;Patient limited by pain;Patient limited by fatigue Patient left: in chair;with call bell/phone within reach Nurse Communication: Mobility status PT Visit Diagnosis: Unsteadiness on feet (R26.81);Other abnormalities of gait and mobility (R26.89);Muscle weakness (generalized) (M62.81)     Time: 2694-8546 PT Time Calculation (min) (ACUTE ONLY):  28 min  Charges:  $Gait Training: 8-22 mins $Therapeutic Exercise: 8-22 mins                     3:43 PM, 08/19/21 Lestine Box, S/PT

## 2021-08-19 NOTE — Discharge Summary (Signed)
Physician Discharge Summary   Patient: Scott Savage MRN: [id number] DOB: 11/02/1955  Admit date:     08/15/2021  Discharge date: 08/19/21  Discharge Physician: Barton Dubois   PCP: Michell Heinrich, DO   Recommendations at discharge:  Repeat basic metabolic panel to follow electrolytes and renal function Reassess blood pressure and adjust antihypertensive regimen as needed. Make sure patient has follow-up with nephrology service as instructed and has also follow-up with orthopedic service as discussed. Repeat CBC to follow platelet count stability.   Discharge Diagnoses: Active Problems:   Acute renal failure superimposed on stage 4 chronic kidney disease (HCC)   Thrombocytopenia (HCC)   Primary osteoarthritis of right knee   SIRS (systemic inflammatory response syndrome) (HCC)   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(Wenckebach's) atrioventricular block   Hyperkalemia   Hyponatremia   Hyperlipidemia   BPH (benign prostatic hyperplasia)  Hospital Course: As per H&P written by Dr. Waldron Labs on 08/15/2021  Scott Savage  is a 66 y.o. male, with medical history significant of hypertension, hyperlipidemia, CKD stage IV, hyperlipidemia, BPH, and to ED secondary to right knee pain, generalized weakness, fatigue and generalized body ache, patient had right knee arthroscopy by Dr. Aline Brochure 6/2, reports he has been having pain since 6/3, was a delay in him getting his pain medicine through his pharmacy, does report generalized body ache, chills, weakness and fatigue as well, he does have some difficulty producing his urine as well, he denies chest pain, shortness of breath, cough and any focal deficits. -In ED he had low-grade temperature 99.3, was tachypneic at 22, blood pressure on admission 105/53, map of 66, urine analysis still pending, rate still pending as well, work-up significant for elevated creatinine at 4.45 from baseline of 2.9, so [facility] hospitalist consulted to admit.  Assessment and Plan: Acute  renal failure superimposed on stage 4 chronic kidney disease ([place]) - In the setting of prerenal azotemia continue use of nephrotoxic agents -Creatinine improving and patient reported increasing urine output. -Continue to maintain adequate hydration and follow renal function trend. -Creatinine reaching baseline range; currently 3.15 at time of discharge; GFR back to his baseline essentially.. -Avoid the use of contrast and avoid hypotension. -No signs of UTI appreciated on UA.   Thrombocytopenia (HCC) -Chronic and appears to be stable -No overt bleeding appreciated -Continue to follow platelets count trend.  BPH (benign prostatic hyperplasia) -Continue the use of Flomax. -Patient denies complaints of urinary retention  Hyperlipidemia - Continue Pravachol.  Hyponatremia -In the setting of dehydration and decreased oral intake -Continue to follow electrolytes trend -Sodium level within normal limits (137) at time of discharge.  Hyperkalemia -In the setting of acute on chronic renal failure -Very mild elevation on 08/17/2021; K 5.0 and is stable at time of discharge. -Will recommend basic metabolic panel follow-up visit to follow electrolytes trend and stability..  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(Wenckebach's) atrioventricular block -Appears to be stable and at baseline -Patient reports no chest pain or palpitations. -No abnormalities appreciated on telemetry.  SIRS (systemic inflammatory response syndrome) (HCC) -No frank source of infection isolated -Antibiotics were discontinued and patient did not for 48 hours without any decompensation or concerns for rising infection.  Primary osteoarthritis of right knee -Status post arthroscopy by Dr. Aline Brochure -Will follow any further recommendation; concern for gouty/pseudogout compromising integrity on patient's left knee. -Received Decadron x2 days as per service recommendation. -Patient reports feeling much better from bilateral knee  pain. -Patient has opted to go home with home health PT and outpatient follow-up with  orthopedic service at discharge.   Consultants: Orthopedic service Procedures performed: See below for x-ray reports. Disposition: Home with home health services. Diet recommendation: Heart healthy diet.  DISCHARGE MEDICATION: Allergies as of 08/19/2021       Reactions   Hydrocodone Itching, Nausea And Vomiting   Other Nausea And Vomiting   Anesthesia-significant nausea/vomiting        Medication List     TAKE these medications    acetaminophen 500 MG tablet Commonly known as: TYLENOL Take 1,000 mg by mouth every 6 (six) hours as needed for moderate pain or headache.   cholecalciferol 25 MCG (1000 UNIT) tablet Commonly known as: VITAMIN D3 Take 1,000 Units by mouth in the morning.   levocetirizine 5 MG tablet Commonly known as: XYZAL Take 5 mg by mouth every evening.   oxyCODONE-acetaminophen 5-325 MG tablet Commonly known as: Percocet Take 1-2 tablets by mouth every 6 (six) hours as needed for up to 7 days for severe pain. What changed:  how much to take when to take this   pantoprazole 40 MG tablet Commonly known as: PROTONIX Take 1 tablet (40 mg total) by mouth daily. Start taking on: August 20, 2021   pravastatin 40 MG tablet Commonly known as: PRAVACHOL Take 40 mg by mouth every evening.   promethazine 12.5 MG tablet Commonly known as: PHENERGAN Take 1 tablet (12.5 mg total) by mouth every 6 (six) hours as needed for nausea or vomiting.   tamsulosin 0.4 MG Caps capsule Commonly known as: FLOMAX Take 0.4 mg by mouth at bedtime.   testosterone cypionate 200 MG/ML injection Commonly known as: DEPOTESTOSTERONE CYPIONATE Inject 200 mg into the muscle every 28 (twenty-eight) days.   valsartan-hydrochlorothiazide 160-12.5 MG tablet Commonly known as: DIOVAN-HCT Take 1 tablet by mouth in the morning. Continue to hold until follow up with PCP What changed: additional  instructions        Contact information for follow-up providers     Michell Heinrich, DO. Schedule an appointment as soon as possible for a visit in 10 day(s).   Specialty: Family Medicine Contact information: [place] [address] [phone]              Contact information for after-discharge care     [facility] .   Service: Skilled Nursing Contact information: [address] [place] [phone]                    Discharge Exam: Danley Danker Weights   08/15/21 1658  Weight: 97.1 kg   General exam: Alert, awake, oriented x 3, reports feeling significantly better; still having some pain in his knees bilateral (but much improved).  Express he feel he can go home and follow instructions for knee movements as dictated by orthopedic service.  Patient is afebrile and reports appetite is improving. Respiratory system: Clear to auscultation. Respiratory effort normal.  No using accessory muscle.  Good saturation on room air. Cardiovascular system:RRR. No murmurs, rubs, gallops.  No JVD. Gastrointestinal system: Abdomen is nondistended, soft and nontender. No organomegaly or masses felt. Normal bowel sounds heard. Central nervous system: Alert and oriented. No focal neurological deficits. Extremities: No cyanosis or clubbing.  Reports improvement in the discomfort of both knees; significant decrease in the swelling of his left knee appreciated. Skin: No petechiae; sutures from recent arthroscopy in right knee appreciated; no drainage.  Psychiatry: Judgement and insight appear normal. Mood & affect appropriate.  Condition at discharge: Stable and improved.  The results of significant diagnostics from this hospitalization (including imaging, microbiology, ancillary and laboratory) are listed below for reference.   Imaging Studies: DG Knee Left Port  Result Date: 08/16/2021 CLINICAL  DATA:  Left knee pain. EXAM: PORTABLE LEFT KNEE - 1-2 VIEW COMPARISON:  None Available. FINDINGS: No evidence of fracture, dislocation, or joint effusion. There is moderate patellofemoral and mild medial compartment joint space narrowing. There is tricompartmental osteophyte formation and spurring of the tibial spines. Soft tissues are within normal limits. IMPRESSION: 1. No acute bony abnormality. 2. Moderate degenerative changes of the knee. Electronically Signed   By: Ronney Asters M.D.   On: 08/16/2021 19:49    Microbiology: Results for orders placed or performed during the hospital encounter of 08/15/21  Resp Panel by RT-PCR (Flu A&B, Covid) Anterior Nasal Swab     Status: None   Collection Time: 08/15/21  7:06 PM   Specimen: Anterior Nasal Swab  Result Value Ref Range Status   SARS Coronavirus 2 by RT PCR NEGATIVE NEGATIVE Final    Comment: (NOTE) SARS-CoV-2 target nucleic acids are NOT DETECTED.  The SARS-CoV-2 RNA is generally detectable in upper respiratory specimens during the acute phase of infection. The lowest concentration of SARS-CoV-2 viral copies this assay can detect is 138 copies/mL. A negative result does not preclude SARS-Cov-2 infection and should not be used as the sole basis for treatment or other patient management decisions. A negative result may occur with  improper specimen collection/handling, submission of specimen other than nasopharyngeal swab, presence of viral mutation(s) within the areas targeted by this assay, and inadequate number of viral copies(<138 copies/mL). A negative result must be combined with clinical observations, patient history, and epidemiological information. The expected result is Negative.  Fact Sheet for Patients:  EntrepreneurPulse.com.au  Fact Sheet for Healthcare Providers:  IncredibleEmployment.be  This test is no t yet approved or cleared by the [place] FDA and  has been authorized for  detection and/or diagnosis of SARS-CoV-2 by FDA under an Emergency Use Authorization (EUA). This EUA will remain  in effect (meaning this test can be used) for the duration of the COVID-19 declaration under Section 564(b)(1) of the Act, 21 U.S.C.section 360bbb-3(b)(1), unless the authorization is terminated  or revoked sooner.       Influenza A by PCR NEGATIVE NEGATIVE Final   Influenza B by PCR NEGATIVE NEGATIVE Final    Comment: (NOTE) The Xpert Xpress SARS-CoV-2/FLU/RSV plus assay is intended as an aid in the diagnosis of influenza from Nasopharyngeal swab specimens and should not be used as a sole basis for treatment. Nasal washings and aspirates are unacceptable for Xpert Xpress SARS-CoV-2/FLU/RSV testing.  Fact Sheet for Patients: EntrepreneurPulse.com.au  Fact Sheet for Healthcare Providers: IncredibleEmployment.be  This test is not yet approved or cleared by the [place] FDA and has been authorized for detection and/or diagnosis of SARS-CoV-2 by FDA under an Emergency Use Authorization (EUA). This EUA will remain in effect (meaning this test can be used) for the duration of the COVID-19 declaration under Section 564(b)(1) of the Act, 21 U.S.C. section 360bbb-3(b)(1), unless the authorization is terminated or revoked.  Performed at [facility], [address], [place], [place] [address]   Group A Strep by PCR     Status: None   Collection Time: 08/15/21  7:09 PM   Specimen: Anterior Nasal Swab; Sterile Swab  Result Value Ref Range Status   Group A Strep by PCR NOT DETECTED NOT  DETECTED Final    Comment: Performed at [facility], [address], [place], [place] [address]  Culture, blood (Routine X 2) w Reflex to ID Panel     Status: None (Preliminary result)   Collection Time: 08/15/21  8:01 PM   Specimen: BLOOD LEFT HAND  Result Value Ref Range Status   Specimen Description BLOOD LEFT HAND  Final   Special Requests   Final     BOTTLES DRAWN AEROBIC AND ANAEROBIC Blood Culture results may not be optimal due to an excessive volume of blood received in culture bottles   Culture   Final    NO GROWTH 4 DAYS Performed at [facility], [address], [place], [place] [address]    Report Status PENDING  Incomplete  Culture, blood (Routine X 2) w Reflex to ID Panel     Status: None (Preliminary result)   Collection Time: 08/15/21  8:01 PM   Specimen: BLOOD RIGHT ARM  Result Value Ref Range Status   Specimen Description BLOOD RIGHT ARM  Final   Special Requests   Final    BOTTLES DRAWN AEROBIC AND ANAEROBIC Blood Culture adequate volume   Culture   Final    NO GROWTH 4 DAYS Performed at [facility], [address] [place], [place] [address]    Report Status PENDING  Incomplete  MRSA Next Gen by PCR, Nasal     Status: None   Collection Time: 08/16/21  8:35 AM   Specimen: Nasal Mucosa; Nasal Swab  Result Value Ref Range Status   MRSA by PCR Next Gen NOT DETECTED NOT DETECTED Final    Comment: (NOTE) The GeneXpert MRSA Assay (FDA approved for NASAL specimens only), is one component of a comprehensive MRSA colonization surveillance program. It is not intended to diagnose MRSA infection nor to guide or monitor treatment for MRSA infections. Test performance is not FDA approved in patients less than 68 years old. Performed at [facility], [address] [place], [place] [address]     Labs: CBC: Recent Labs  Lab 08/15/21 1723 08/16/21 0340  WBC 8.8 6.7  NEUTROABS 5.3  --   HGB 10.0* 8.8*  HCT 29.4* 26.8*  MCV 83.3 85.9  PLT 224 888   Basic Metabolic Panel: Recent Labs  Lab 08/15/21 1723 08/16/21 0340 08/17/21 0614 08/18/21 0457 08/19/21 1001  NA 128* 134* 132* 137 137  K 4.7 4.7 5.3* 5.0 5.0  CL 99 106 107 111 113*  CO2 17* 20* 18* 17* 20*  GLUCOSE 132* 116* 174* 143* 123*  BUN 60* 61* 67* 67* 61*  CREATININE 4.45* 4.16* 4.08* 3.32* 3.15*  CALCIUM 8.4* 8.5* 8.5* 8.8* 8.6*   Liver  Function Tests: Recent Labs  Lab 08/15/21 1723 08/16/21 0340  AST 24 21  ALT 22 20  ALKPHOS 98 97  BILITOT 1.1 0.7  PROT 7.5 6.7  ALBUMIN 3.4* 3.0*   CBG: No results for input(s): "GLUCAP" in the last 168 hours.  Discharge time spent: greater than 30 minutes.  Signed: Barton Dubois, MD [facility] 08/19/2021

## 2021-08-19 NOTE — Telephone Encounter (Signed)
Have orders printed and signed will call him in am to see where he wants me to send them.

## 2021-08-19 NOTE — Telephone Encounter (Signed)
Patient called to relay he is now home from [facility]. Michela Pitcher Dr Aline Brochure knows about ordering him a wheelchair and a shower chair. I asked if I may reschedule his appointment from 08/16/21, and he said, 'no, they are supposed to come to the house.'  Please advise.

## 2021-08-19 NOTE — Care Management Important Message (Signed)
Important Message  Patient Details  Name: Scott Savage MRN: [id number] Date of Birth: 1955-03-17   Medicare Important Message Given:  Yes (spoke with Mr. Dunavant to review letter, agreeable to verbal, expressed understanding, no additional letter needed.)     Tommy Medal 08/19/2021, 3:55 PM

## 2021-08-20 ENCOUNTER — Encounter (HOSPITAL_COMMUNITY): Payer: Medicare PPO

## 2021-08-20 LAB — CULTURE, BLOOD (ROUTINE X 2)
Culture: NO GROWTH
Culture: NO GROWTH
Special Requests: ADEQUATE

## 2021-08-20 NOTE — Telephone Encounter (Signed)
Called to see where he wants orders sent. [facility] has these he wants orders to go there, I will get the number and send for him.

## 2021-08-20 NOTE — Telephone Encounter (Signed)
Ph [phone]   I called to get fax number left message for [employer] to call me back with a fax to you FYI they will hopefully call back with a fax number

## 2021-08-21 ENCOUNTER — Encounter (HOSPITAL_COMMUNITY): Payer: Medicare PPO

## 2021-08-21 NOTE — Telephone Encounter (Signed)
[phone] is the fax number per Arbie Cookey, I have faxed the orders for him.

## 2021-08-22 ENCOUNTER — Telehealth: Payer: Self-pay | Admitting: Orthopedic Surgery

## 2021-08-22 ENCOUNTER — Encounter (HOSPITAL_COMMUNITY): Payer: Medicare PPO | Admitting: Physical Therapy

## 2021-08-22 NOTE — Telephone Encounter (Signed)
Call from [place], [place], requesting notes for the order for shower chair. Again verified their contact information:  Fax [phone] / [phone]

## 2021-08-23 ENCOUNTER — Encounter (HOSPITAL_COMMUNITY): Payer: Medicare PPO

## 2021-08-26 ENCOUNTER — Ambulatory Visit (INDEPENDENT_AMBULATORY_CARE_PROVIDER_SITE_OTHER): Payer: Medicare PPO | Admitting: Orthopedic Surgery

## 2021-08-26 ENCOUNTER — Encounter (HOSPITAL_COMMUNITY): Payer: Medicare PPO

## 2021-08-26 ENCOUNTER — Encounter: Payer: Self-pay | Admitting: Orthopedic Surgery

## 2021-08-26 DIAGNOSIS — M23321 Other meniscus derangements, posterior horn of medial meniscus, right knee: Secondary | ICD-10-CM

## 2021-08-26 MED ORDER — OXYCODONE-ACETAMINOPHEN 5-325 MG PO TABS: 1.0000 | ORAL_TABLET | Freq: Four times a day (QID) | ORAL | 0 refills | Status: AC | PRN

## 2021-08-26 NOTE — Progress Notes (Signed)
Chief Complaint  Patient presents with   Post-op Follow-up    08/09/21 knee arthroscopy right    Mr. Altemus had routine knee arthroscopy of his right knee.  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   NOTCH  -acl degenerative fraying -pcl degenerative fraying   However his postop course was complicated by acute renal failure which put him in the hospital with a 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 which was treated with IV steroids  He presents now walking with a walker with improved left knee no real discomfort pain or swelling, mild discomfort right knee and swelling and pain right foot  Exam right foot swelling on the dorsum of the foot normal ankle motion soft tissue is tender over the dorsum of the foot  Portals are clean sutures were removed in the hospital on the right knee  Left knee range of motion normal no tenderness or swelling  Recommend:  Shower chair needed decreased mobility after surgery   We refilled his Percocet Meds ordered this encounter  Medications   oxyCODONE-acetaminophen (PERCOCET) 5-325 MG tablet    Sig: Take 1 tablet by mouth every 6 (six) hours as needed for up to 7 days for severe pain.    Dispense:  28 tablet    Refill:  0   We gave him exercises to be done at home  [employer] knee brace hinged brace for stability  Follow-up in 4 weeks

## 2021-08-26 NOTE — Patient Instructions (Addendum)
Right foot epsom salt soak 30 min daily plus liniment like Ben-gay   Brace on the right knee  Exercises right knee daily  Refill pain medicine

## 2021-08-27 ENCOUNTER — Other Ambulatory Visit: Payer: Self-pay | Admitting: Orthopedic Surgery

## 2021-08-27 ENCOUNTER — Encounter (HOSPITAL_COMMUNITY): Payer: Medicare PPO | Admitting: Physical Therapy

## 2021-08-27 MED ORDER — OXYCODONE-ACETAMINOPHEN 5-325 MG PO TABS: 1.0000 | ORAL_TABLET | Freq: Three times a day (TID) | ORAL | 0 refills | Status: AC | PRN

## 2021-08-27 NOTE — Telephone Encounter (Signed)
Done

## 2021-08-27 NOTE — Progress Notes (Signed)
Meds ordered this encounter  Medications   oxyCODONE-acetaminophen (PERCOCET) 5-325 MG tablet    Sig: Take 1 tablet by mouth every 8 (eight) hours as needed for up to 5 days for severe pain.    Dispense:  15 tablet    Refill:  0    6/26

## 2021-08-28 ENCOUNTER — Encounter (HOSPITAL_COMMUNITY): Payer: Medicare PPO | Admitting: Physical Therapy

## 2021-08-29 ENCOUNTER — Inpatient Hospital Stay (HOSPITAL_COMMUNITY): Payer: Medicare PPO | Admitting: Hematology

## 2021-08-29 ENCOUNTER — Encounter (HOSPITAL_COMMUNITY): Payer: Medicare PPO | Admitting: Physical Therapy

## 2021-08-30 ENCOUNTER — Encounter (HOSPITAL_COMMUNITY): Payer: Medicare PPO

## 2021-09-02 ENCOUNTER — Encounter (HOSPITAL_COMMUNITY): Payer: Medicare PPO | Admitting: Physical Therapy

## 2021-09-03 ENCOUNTER — Encounter (HOSPITAL_COMMUNITY): Payer: Medicare PPO

## 2021-09-03 ENCOUNTER — Telehealth: Payer: Self-pay | Admitting: Radiology

## 2021-09-03 ENCOUNTER — Ambulatory Visit (HOSPITAL_COMMUNITY): Payer: Medicare PPO

## 2021-09-04 ENCOUNTER — Encounter (HOSPITAL_COMMUNITY): Payer: Medicare PPO | Admitting: Physical Therapy

## 2021-09-05 ENCOUNTER — Telehealth: Payer: Self-pay

## 2021-09-05 ENCOUNTER — Encounter (HOSPITAL_COMMUNITY): Payer: Medicare PPO | Admitting: Physical Therapy

## 2021-09-05 NOTE — Telephone Encounter (Signed)
Linda with [facility] called asking for a verbal or faxed order to discharge patient. He starts PT on Monday 09/09/21 at [facility]. Phone: [phone] Fax: [phone]

## 2021-09-05 NOTE — Telephone Encounter (Signed)
I called and gave verbal to d/c from [facility].

## 2021-09-06 ENCOUNTER — Encounter (HOSPITAL_COMMUNITY): Payer: Medicare PPO | Admitting: Physical Therapy

## 2021-09-09 ENCOUNTER — Encounter (HOSPITAL_COMMUNITY): Payer: Self-pay

## 2021-09-09 ENCOUNTER — Ambulatory Visit (HOSPITAL_COMMUNITY): Payer: Medicare PPO | Attending: Orthopedic Surgery

## 2021-09-09 DIAGNOSIS — M6281 Muscle weakness (generalized): Secondary | ICD-10-CM | POA: Insufficient documentation

## 2021-09-09 DIAGNOSIS — M25561 Pain in right knee: Secondary | ICD-10-CM | POA: Insufficient documentation

## 2021-09-09 DIAGNOSIS — M25661 Stiffness of right knee, not elsewhere classified: Secondary | ICD-10-CM | POA: Diagnosis not present

## 2021-09-09 DIAGNOSIS — R262 Difficulty in walking, not elsewhere classified: Secondary | ICD-10-CM | POA: Insufficient documentation

## 2021-09-09 NOTE — Therapy (Signed)
OUTPATIENT PHYSICAL THERAPY LOWER EXTREMITY Treatment   Patient Name: Scott Savage MRN: [id number] DOB:[birth year], 66 y.o., male Today's Date: 09/09/2021   PT End of Session - 09/09/21 1717     Visit Number 2    Number of Visits 12    Date for PT Re-Evaluation 09/26/21    Authorization Type [employer] (auth required) 12 visits approved until 09/26/21    Authorization - Visit Number 2    Authorization - Number of Visits 12    Progress Note Due on Visit 12    PT Start Time 0354    PT Stop Time 6568    PT Time Calculation (min) 35 min           Progress Note   Reporting Period 08/15/21 to 09/09/21   See note below for Objective Data and Assessment of Progress/Goals    Past Medical History:  Diagnosis Date   HTN (hypertension)    Hyperlipemia    PONV (postoperative nausea and vomiting)    Urinary complications    Uses flomax to help empty bladder   Past Surgical History:  Procedure Laterality Date   BIOPSY  08/01/2019   Procedure: BIOPSY;  Surgeon: Rogene Houston, MD;  [facility];  Service: Endoscopy;;   COLONOSCOPY N/A 08/01/2019   Procedure: COLONOSCOPY;  Surgeon: Rogene Houston, MD;  [facility];  Service: Endoscopy;  Laterality: N/A;  730   KNEE ARTHROSCOPY WITH LATERAL MENISECTOMY Right 08/09/2021   Procedure: KNEE ARTHROSCOPY WITH LATERAL MENISECTOMY;  Surgeon: Carole Civil, MD;  Location: [facility];  Service: Orthopedics;  Laterality: Right;   KNEE ARTHROSCOPY WITH MEDIAL MENISECTOMY Right 08/09/2021   Procedure: KNEE ARTHROSCOPY WITH MEDIAL MENISECTOMY;  Surgeon: Carole Civil, MD;  Location: [facility];  Service: Orthopedics;  Laterality: Right;   left foot  2011   [facility]   left shoulder  2008   Short Stay in [place]  07/04/2011   Procedure: RESECTION DISTAL CLAVICAL;  Surgeon: Carole Civil, MD;  Location: [facility];  Service: Orthopedics;  Laterality: Right;   SHOULDER OPEN  ROTATOR CUFF REPAIR  07/04/2011   Procedure: ROTATOR CUFF REPAIR SHOULDER OPEN;  Surgeon: Carole Civil, MD;  Location: [facility];  Service: Orthopedics;  Laterality: Right;   Patient Active Problem List   Diagnosis Date Noted   Hyponatremia 08/16/2021   Hyperlipidemia 08/16/2021   BPH (benign prostatic hyperplasia) 08/16/2021   Derangement of posterior horn of medial meniscus of right knee    Meniscus, lateral, derangement, right    Acute bronchitis 06/27/2021   Hyperkalemia 06/27/2021   SIRS (systemic inflammatory response syndrome) ([place]) 06/26/2021   Thrombocytopenia ([place]) 06/26/2021   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[facility]) atrioventricular block    Elevated troponin 06/24/2021   Acute renal failure superimposed on stage 4 chronic kidney disease ([place]) 06/24/2021   Anemia 06/24/2021   Primary osteoarthritis of right knee 09/06/2018   Hypertensive heart disease without congestive heart failure 09/02/2018   Adjustment disorder 03/18/2018   Chronic kidney disease, stage 4 (severe) ([place]) 09/08/2016   Hypogonadism in male 02/27/2016   S/P rotator cuff repair 09/25/2011   Rotator cuff tear, right 05/06/2011   Shoulder pain 05/06/2011    PCP: Michell Heinrich  REFERRING PROVIDER: Arther Abbott     REFERRING DIAG: KNEE ARTHROSCOPY WITH LATERAL MENISECTOMY  KNEE ARTHROSCOPY WITH MEDIAL MENISECTOMY  THERAPY DIAG:  Stiffness of right knee, not elsewhere classified  Muscle weakness (generalized)  Acute pain of right knee  Difficulty in walking, not elsewhere classified  Rationale for Evaluation and Treatment Rehabilitation  ONSET DATE: 08/09/2021  SUBJECTIVE:   SUBJECTIVE STATEMENT:pt was here for eval on 08/15/21. Patient sent to ER as he was having excruciating pain in b/l LE and could not walk.  patient received antibiotic and steroids in hospital. Pt states that he unsure what the actual diagnosis was that caused all his pain. MD note states that patient has acute kidney failure  that caused gout in knee.  Patient states that he was able to start walking in mid June. Pt states he continues to have pain in ankles that switches back and forth.patient feels most limited by these ankle pains that come and go. Patient wants to know what PT plan for his ankles bothering him is. Patient feels right knee is coming along, for still feels it is wobbly, after walking a bit starts to feel a band tightening, and feels tightness and popping. Patient ambulates in using [facility] and has donjoy hinged knee brace on right side.   FROM IE: Pt states that he had arthroscopic surgery on 08/09/2021.  He states that the only thing he has been able to do since the surgery is the CPM, he has done no exercises.  He states that he was walking without a cane or walker prior the surgery but currently he can not even walk to the bathroom.  The pt is constantly groaning, shaking and moaning.   PERTINENT HISTORY: OA, Rt rotator cuff tear   PAIN:  Are you having pain? Yes: NPRS scale: 10/10 Pain location: knee  Pain description: rt  Aggravating factors: standing  Relieving factors: weight bearing   PRECAUTIONS: falls   WEIGHT BEARING RESTRICTIONS  WBAT  FALLS:  Has patient fallen in last 6 months? No  LIVING ENVIRONMENT: Lives with: lives with with their spouse Lives in: House/apartment Stairs: no  Has following equipment at home: Environmental consultant - 2 wheeled  OCCUPATION: retired   PLOF: Independent  PATIENT GOALS :  less pain, improved mobility, improved strength and to be able to walk without pain.    OBJECTIVE:   DIAGNOSTIC FINDINGS: IMPRESSION: (pre surgery) 1. 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 2. Diffuse complex tearing of the posterior horn of the medial meniscus with near absence of meniscal tissue. 3. Oblique undersurface tear within the anterior horn of the lateral meniscus with additional horizontal tear extending through  the anterior wall into a tiny anterior parameniscal cyst. 4. Moderate joint effusion. 5. Small Baker's cyst containing low signal tiny loose bodies. Additional possibly connected fluid seen deep and lateral to the popliteal vessels, also containing small loose bodies. 6. Mild chronic proximal patellar tendinosis with likely small tiny midsubstance partial-thickness tear. 7. Loose body measuring up to 24 mm within the lateral suprapatellar joint space.  PATIENT SURVEYS:  FOTO .5;   COGNITION:  Overall cognitive status: Within functional limits for tasks assessed       EDEMA:  Minimal  GAIT Modified Independent SAC Hinged neoprene right knee brace Slowed cadence, decreased right knee flexion/extension   2 MWT=323 feet no AD  Stairs Requires cueing for sequence as patient has not done any stairs yet Ascends reciprocal with uni HR at CS Descends step to with uni HR at CS  Transfers at independent with UE support  LOWER EXTREMITY ROM:  Active ROM Right eval Left eval Right 09/09/21  Hip flexion   110  Hip extension  Hip abduction     Hip adduction     Hip internal rotation     Hip external rotation     Knee flexion     Knee extension -7 30 -4  Ankle dorsiflexion     Ankle plantarflexion     Ankle inversion     Ankle eversion      (Blank rows = not tested)  LOWER EXTREMITY MMT:  MMT Right  eval Right 09/09/21 Left 09/09/21  Hip flexion  3+ 5  Hip extension  3+   Hip abduction  4   Hip adduction  3+   Hip internal rotation     Hip external rotation     Knee flexion     Knee extension 3- 4 5  Ankle dorsiflexion  5 5  Ankle plantarflexion     Ankle inversion  5 5  Ankle eversion  5 5   (Blank rows = not tested)   FUNCTIONAL TESTS:  Sit to stand:  Mod to max assist Transfers:  max assist  1 minute walk test: able to stand and walk 4 ft when he states he feels dizzy     TODAY'S TREATMENT:  09/09/21 Discussion on HPI and POC Review of HEP x 5-10  reps each Assessed 2 mwt, stairs, mmt, rom Educated pt on questions regarding gym, building slowly for knee exercises  08/15/2021:  Evaluation ankle pumps.                   PATIENT EDUCATION:  Education details: HEP  Person educated: Restaurant manager, fast food: Consulting civil engineer, Verbal cues, and Handouts Education comprehension: verbalized understanding  HOME EXERCISE PROGRAM: [id number]             Ankle pumps LAQ Quad set Rt heel slide SLR   ASSESSMENT:  CLINICAL IMPRESSION: Patient presents today vastly different than how eval reads, pt reports he has made much improvements since last being here. Patient was in hospital and was treated with antibiotics and steroids. Patient states that he is not sure what the cause of everything was. From quick chart review it appears that patients kidney failure may have caused him gout. Patients chief complaint is of b/l ankle/foot pain that switches side, pt finds this more limiting than right knee. Advised patient that he needs to have a visit to PCP as he may still be having gout. Encouraged patient to eat heart healthy diet and avoid  sugars, cured and red meats. Patient feels knee is "wobbly" and ambulates with neoprene hinged donjoy knee brace. Pt utilizes [facility] for community but reports limited cane use in home. Patient  presents today with limitations in rom, strength and ambulation and stair performance. Patient ambulates 39' on 2 mwt without ad. Reviewed patients current HEP and initial goals set on IE, discussed new goals for patient. Patient required some cueing and instruction for full proper performance with exercises and working towards increased range. Patient will benefit from skilled PT services to improve above knee impairments and work towards optimal level of function with decreased restriction.  OBJECTIVE IMPAIRMENTS Abnormal gait, decreased activity tolerance, decreased balance, difficulty walking, decreased ROM, decreased strength,  increased edema, and pain.   ACTIVITY LIMITATIONS carrying, lifting, sitting, standing, squatting, stairs, and locomotion level  PARTICIPATION LIMITATIONS: cleaning, driving, shopping, community activity, and yard work  [place] are also affecting patient's functional outcome.   REHAB POTENTIAL: Good  CLINICAL DECISION MAKING: Unstable/unpredictable  EVALUATION COMPLEXITY: Low   GOALS: Goals reviewed with  patient? Yes  SHORT TERM GOALS: Target date: 09/30/2021  PT to be I in HEP to decrease pain to no greater than a 5/10 Baseline: Current: needs instruction for range improving exercises to get actual benefit from exercise Goal status: IN PROGRESS  2.  PT Rt knee ROM to be at least 2-115 to allow pt to be comfortable sitting for a half hour. Baseline:  Current: 4 to 110 Goal status: REVISED  3.  PT to be able to come sit to stand with UE  with mod I  Baseline:  Current: Independent with UE support Goal status: MET  4.  PT to be able to transfer Independent no UE support Baseline:  Current: Independent with UE support Goal status: REVISED  5.  PT to be able to ambulate with least assistive device for five minutes  Baseline:  Current: 2 minutes Goal status: IN PROGRESS    LONG TERM GOALS: Target date: 10/21/2021   PT to be I in advanced HEP to allow pain to decrease to no greater than a 2/10 Baseline:  Goal status: IN PROGRESS  2.  Pt Rt LE ROM to be 0-120 to allow pt to be able to squat down to pick an item off the floor.  Baseline:  Current: 4-110 Goal status: IN PROGRESS  3.  Pt strength in Rt LE to be increased to at least 4+/5 to be able to go up and down steps in a reciprocal manner.  Baseline:  Current:  Right 09/09/21  3+  3+  4  3+        '4  5    5  5   ' Goal status: IN PROGRESS  4.  Pt to be able to ambulate for over 30 minutes without difficulty with least assistive device  Baseline:  Current: 2-3 minutes at a time Goal  status: IN PROGRESS   PLAN: PT FREQUENCY: 2-3x/week  PT DURATION: 6 weeks  PLANNED INTERVENTIONS: Therapeutic exercises, Balance training, Gait training, Patient/Family education, Cryotherapy, and Manual therapy  PLAN FOR NEXT SESSION: Discuss reduction from 3x week to 2x week.  Update HEP including squats, bridges, prone tband hamstring curls,etc.  begin weight bearing exercises  to include heel raises, squats, standing knee flexion, standing terminal extension, rockerboard. Continue to progress ROM    Marq Rebello PT, DPT  09/09/2021

## 2021-09-10 ENCOUNTER — Encounter (HOSPITAL_COMMUNITY): Payer: Medicare PPO | Admitting: Physical Therapy

## 2021-09-11 ENCOUNTER — Ambulatory Visit (HOSPITAL_COMMUNITY): Payer: Medicare PPO | Admitting: Physical Therapy

## 2021-09-11 DIAGNOSIS — R262 Difficulty in walking, not elsewhere classified: Secondary | ICD-10-CM

## 2021-09-11 DIAGNOSIS — M25661 Stiffness of right knee, not elsewhere classified: Secondary | ICD-10-CM | POA: Diagnosis not present

## 2021-09-11 DIAGNOSIS — M6281 Muscle weakness (generalized): Secondary | ICD-10-CM

## 2021-09-11 DIAGNOSIS — M25561 Pain in right knee: Secondary | ICD-10-CM

## 2021-09-11 NOTE — Therapy (Signed)
OUTPATIENT PHYSICAL THERAPY LOWER EXTREMITY Treatment   Patient Name: Scott Savage MRN: [id number] DOB:08/20/1955, 66 y.o., male Today's Date: 09/09/2021   PT End of Session - 09/09/21 1717     Visit Number 3   Number of Visits 12    Date for PT Re-Evaluation 09/26/21    Authorization Type [employer] (auth required) 12 visits approved until 09/26/21    Authorization - Visit Number 3   Authorization - Number of Visits 12    Progress Note Due on Visit 12    PT Start Time 2863   PT Stop Time 1643   PT Time Calculation (min) 38 min            Past Medical History:  Diagnosis Date   HTN (hypertension)    Hyperlipemia    PONV (postoperative nausea and vomiting)    Urinary complications    Uses flomax to help empty bladder   Past Surgical History:  Procedure Laterality Date   BIOPSY  08/01/2019   Procedure: BIOPSY;  Surgeon: Rogene Houston, MD;  [facility];  Service: Endoscopy;;   COLONOSCOPY N/A 08/01/2019   Procedure: COLONOSCOPY;  Surgeon: Rogene Houston, MD;  [facility];  Service: Endoscopy;  Laterality: N/A;  730   KNEE ARTHROSCOPY WITH LATERAL MENISECTOMY Right 08/09/2021   Procedure: KNEE ARTHROSCOPY WITH LATERAL MENISECTOMY;  Surgeon: Carole Civil, MD;  Location: [facility];  Service: Orthopedics;  Laterality: Right;   KNEE ARTHROSCOPY WITH MEDIAL MENISECTOMY Right 08/09/2021   Procedure: KNEE ARTHROSCOPY WITH MEDIAL MENISECTOMY;  Surgeon: Carole Civil, MD;  Location: [facility];  Service: Orthopedics;  Laterality: Right;   left foot  2011   [facility]   left shoulder  2008   Short Stay in [place]  07/04/2011   Procedure: RESECTION DISTAL CLAVICAL;  Surgeon: Carole Civil, MD;  Location: [facility];  Service: Orthopedics;  Laterality: Right;   SHOULDER OPEN ROTATOR CUFF REPAIR  07/04/2011   Procedure: ROTATOR CUFF REPAIR SHOULDER OPEN;  Surgeon: Carole Civil, MD;  Location: [facility];   Service: Orthopedics;  Laterality: Right;   Patient Active Problem List   Diagnosis Date Noted   Hyponatremia 08/16/2021   Hyperlipidemia 08/16/2021   BPH (benign prostatic hyperplasia) 08/16/2021   Derangement of posterior horn of medial meniscus of right knee    Meniscus, lateral, derangement, right    Acute bronchitis 06/27/2021   Hyperkalemia 06/27/2021   SIRS (systemic inflammatory response syndrome) ([place]) 06/26/2021   Thrombocytopenia ([place]) 06/26/2021   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[facility]) atrioventricular block    Elevated troponin 06/24/2021   Acute renal failure superimposed on stage 4 chronic kidney disease ([place]) 06/24/2021   Anemia 06/24/2021   Primary osteoarthritis of right knee 09/06/2018   Hypertensive heart disease without congestive heart failure 09/02/2018   Adjustment disorder 03/18/2018   Chronic kidney disease, stage 4 (severe) ([place]) 09/08/2016   Hypogonadism in male 02/27/2016   S/P rotator cuff repair 09/25/2011   Rotator cuff tear, right 05/06/2011   Shoulder pain 05/06/2011    PCP: Michell Heinrich  REFERRING PROVIDER: Arther Abbott     REFERRING DIAG: KNEE ARTHROSCOPY WITH LATERAL MENISECTOMY  KNEE ARTHROSCOPY WITH MEDIAL MENISECTOMY  THERAPY DIAG:  Stiffness of right knee, not elsewhere classified  Muscle weakness (generalized)  Acute pain of right knee  Difficulty in walking, not elsewhere classified  Rationale for Evaluation and Treatment Rehabilitation  ONSET DATE: 08/09/2021  SUBJECTIVE:  SUBJECTIVE STATEMENT:   Pt states that his knee is feeling pretty good.  His main pain continues to be his feet. He is walking a lot better.  FROM IE: Pt states that he had arthroscopic surgery on 08/09/2021.  He states that the only thing he has been able to do since the surgery is the CPM, he has done no exercises.  He states that he was walking without a cane or walker prior the surgery but currently he can not even walk to the bathroom.  The pt is  constantly groaning, shaking and moaning.   PERTINENT HISTORY: OA, Rt rotator cuff tear   PAIN:  Are you having pain? Yes: NPRS scale: 2/10 Pain location: knee  Pain description: rt  Aggravating factors: standing  Relieving factors: weight bearing  Pain in ankles 4/10.   OCCUPATION: retired   PLOF: Independent  PATIENT GOALS :  less pain, improved mobility, improved strength and to be able to walk without pain.    OBJECTIVE:   DIAGNOSTIC FINDINGS: IMPRESSION: (pre surgery) 1. 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 2. Diffuse complex tearing of the posterior horn of the medial meniscus with near absence of meniscal tissue. 3. Oblique undersurface tear within the anterior horn of the lateral meniscus with additional horizontal tear extending through the anterior wall into a tiny anterior parameniscal cyst. 4. Moderate joint effusion. 5. Small Baker's cyst containing low signal tiny loose bodies. Additional possibly connected fluid seen deep and lateral to the popliteal vessels, also containing small loose bodies. 6. Mild chronic proximal patellar tendinosis with likely small tiny midsubstance partial-thickness tear. 7. Loose body measuring up to 24 mm within the lateral suprapatellar joint space.  PATIENT SURVEYS:  FOTO .5;    2 MWT=323 feet no AD  Stairs Requires cueing for sequence as patient has not done any stairs yet Ascends reciprocal with uni HR at CS Descends step to with uni HR at CS  Transfers at independent with UE support  LOWER EXTREMITY ROM:  Active ROM Right eval Left eval Right 09/09/21  Hip flexion   110  Hip extension     Hip abduction     Hip adduction     Hip internal rotation     Hip external rotation     Knee flexion     Knee extension -7 30 -4  Ankle dorsiflexion     Ankle plantarflexion     Ankle inversion     Ankle eversion      (Blank rows = not tested)  LOWER EXTREMITY  MMT:  MMT Right  eval Right 09/09/21 Left 09/09/21  Hip flexion  3+ 5  Hip extension  3+   Hip abduction  4   Hip adduction  3+   Hip internal rotation     Hip external rotation     Knee flexion     Knee extension 3- 4 5  Ankle dorsiflexion  5 5  Ankle plantarflexion     Ankle inversion  5 5  Ankle eversion  5 5   (Blank rows = not tested)   FUNCTIONAL TESTS:  Sit to stand:  Mod to max assist Transfers:  max assist  1 minute walk test: able to stand and walk 4 ft when he states he feels dizzy     TODAY'S TREATMENT: 09/11/21 Standing:  heel raises x 10                  Toe raises  x 10                  Functional squat x 10                  Lunge B x 10                   Single leg stance 5 reps B                  Terminal extension 10 reps x 2 at bodycraft using 3 PL                   Slant board stretch 3 x 30"                  Gastroc stretch x 30" x 3 Rt Sitting :  sit to stand x 5 2 sets               Ankle pumps x 15   09/09/21 Discussion on HPI and POC Review of HEP x 5-10 reps each Assessed 2 mwt, stairs, mmt, rom Educated pt on questions regarding gym, building slowly for knee exercises  08/15/2021:  Evaluation ankle pumps.                   PATIENT EDUCATION:  Education details: HEP  Person educated: Restaurant manager, fast food: Consulting civil engineer, Verbal cues, and Handouts Education comprehension: verbalized understanding  HOME EXERCISE PROGRAM:  [id number] 7/5:       Added standing:  Heel raises , toe raises, squats                          Sitting sit to stand              Ankle pumps LAQ Quad set Rt heel slide SLR   ASSESSMENT:  CLINICAL IMPRESSION: Updated pt HEP.  Progressed pt to standing strengthening and stretching exercises.  Sit to stand to progress functional ability.    Patient required some cueing and instruction for full proper performance with exercises and working towards increased range. Patient will benefit from skilled PT services to  improve above knee impairments and work towards optimal level of function with decreased restriction.  OBJECTIVE IMPAIRMENTS Abnormal gait, decreased activity tolerance, decreased balance, difficulty walking, decreased ROM, decreased strength, increased edema, and pain.   ACTIVITY LIMITATIONS carrying, lifting, sitting, standing, squatting, stairs, and locomotion level  PARTICIPATION LIMITATIONS: cleaning, driving, shopping, community activity, and yard work  [place] are also affecting patient's functional outcome.   REHAB POTENTIAL: Good  CLINICAL DECISION MAKING: Unstable/unpredictable  EVALUATION COMPLEXITY: Low   GOALS: Goals reviewed with patient? Yes  SHORT TERM GOALS: Target date: 09/30/2021  PT to be I in HEP to decrease pain to no greater than a 5/10 Baseline: Current: needs instruction for range improving exercises to get actual benefit from exercise Goal status: IN PROGRESS  2.  PT Rt knee ROM to be at least 2-115 to allow pt to be comfortable sitting for a half hour. Baseline:  Current: 4 to 110 Goal status: REVISED  3.  PT to be able to come sit to stand with UE  with mod I  Baseline:  Current: Independent with UE support Goal status: MET  4.  PT to be able to transfer Independent no UE support Baseline:  Current: Independent with UE support Goal status: REVISED  5.  PT to be able  to ambulate with least assistive device for five minutes  Baseline:  Current: 2 minutes Goal status: IN PROGRESS    LONG TERM GOALS: Target date: 10/21/2021   PT to be I in advanced HEP to allow pain to decrease to no greater than a 2/10 Baseline:  Goal status: IN PROGRESS  2.  Pt Rt LE ROM to be 0-120 to allow pt to be able to squat down to pick an item off the floor.  Baseline:  Current: 4-110 Goal status: IN PROGRESS  3.  Pt strength in Rt LE to be increased to at least 4+/5 to be able to go up and down steps in a reciprocal manner.  Baseline:   Current:  Right 09/09/21  3+  3+  4  3+        _0 Goal status: IN PROGRESS  4.  Pt to be able to ambulate for over 30 minutes without difficulty with least assistive device  Baseline:  Current: 2-3 minutes at a time Goal status: IN PROGRESS   PLAN: PT FREQUENCY: 2-3x/week  PT DURATION: 6 weeks  PLANNED INTERVENTIONS: Therapeutic exercises, Balance training, Gait training, Patient/Family education, Cryotherapy, and Manual therapy  PLAN FOR NEXT SESSION: Discuss reduction from 3x week to 2x week.  Update HEP including squats, bridges, prone tband hamstring curls,etc.  Continue weight bearing exercises  , standing knee flexion, and rocker board. Continue to progress ROM   Rayetta Humphrey, PT CLT [phone]  1646 PM

## 2021-09-12 ENCOUNTER — Encounter (HOSPITAL_COMMUNITY): Payer: Medicare PPO | Admitting: Physical Therapy

## 2021-09-13 ENCOUNTER — Encounter (HOSPITAL_COMMUNITY): Payer: Medicare PPO | Admitting: Hematology

## 2021-09-13 ENCOUNTER — Ambulatory Visit (HOSPITAL_COMMUNITY): Payer: Medicare PPO

## 2021-09-13 DIAGNOSIS — M6281 Muscle weakness (generalized): Secondary | ICD-10-CM

## 2021-09-13 DIAGNOSIS — M25561 Pain in right knee: Secondary | ICD-10-CM

## 2021-09-13 DIAGNOSIS — M25661 Stiffness of right knee, not elsewhere classified: Secondary | ICD-10-CM | POA: Diagnosis not present

## 2021-09-13 DIAGNOSIS — R262 Difficulty in walking, not elsewhere classified: Secondary | ICD-10-CM

## 2021-09-13 NOTE — Therapy (Addendum)
OUTPATIENT PHYSICAL THERAPY LOWER EXTREMITY Treatment   Patient Name: Scott Savage MRN: [id number] DOB:Feb 14, 1956, 66 y.o., male Today's Date: 09/09/2021    09/13/21 1141  PT Visits / Re-Eval  Visit Number 4  Number of Visits 12  Date for PT Re-Evaluation 09/26/21  Authorization  Authorization Type [employer] (auth required) 12 visits approved until 09/26/21  Authorization - Visit Number 4  Authorization - Number of Visits 12  Progress Note Due on Visit 12  PT Time Calculation  PT Start Time 1120  PT Stop Time 1200  PT Time Calculation (min) 40 min  PT - End of Session  Activity Tolerance Patient tolerated treatment well  Behavior During Therapy WFL for tasks assessed/performed      Past Medical History:  Diagnosis Date   HTN (hypertension)    Hyperlipemia    PONV (postoperative nausea and vomiting)    Urinary complications    Uses flomax to help empty bladder   Past Surgical History:  Procedure Laterality Date   BIOPSY  08/01/2019   Procedure: BIOPSY;  Surgeon: Rogene Houston, MD;  [facility];  Service: Endoscopy;;   COLONOSCOPY N/A 08/01/2019   Procedure: COLONOSCOPY;  Surgeon: Rogene Houston, MD;  [facility];  Service: Endoscopy;  Laterality: N/A;  730   KNEE ARTHROSCOPY WITH LATERAL MENISECTOMY Right 08/09/2021   Procedure: KNEE ARTHROSCOPY WITH LATERAL MENISECTOMY;  Surgeon: Carole Civil, MD;  Location: [facility];  Service: Orthopedics;  Laterality: Right;   KNEE ARTHROSCOPY WITH MEDIAL MENISECTOMY Right 08/09/2021   Procedure: KNEE ARTHROSCOPY WITH MEDIAL MENISECTOMY;  Surgeon: Carole Civil, MD;  Location: [facility];  Service: Orthopedics;  Laterality: Right;   left foot  2011   [facility]   left shoulder  2008   Short Stay in [place]  07/04/2011   Procedure: RESECTION DISTAL CLAVICAL;  Surgeon: Carole Civil, MD;  Location: [facility];  Service: Orthopedics;  Laterality: Right;    SHOULDER OPEN ROTATOR CUFF REPAIR  07/04/2011   Procedure: ROTATOR CUFF REPAIR SHOULDER OPEN;  Surgeon: Carole Civil, MD;  Location: [facility];  Service: Orthopedics;  Laterality: Right;   Patient Active Problem List   Diagnosis Date Noted   Hyponatremia 08/16/2021   Hyperlipidemia 08/16/2021   BPH (benign prostatic hyperplasia) 08/16/2021   Derangement of posterior horn of medial meniscus of right knee    Meniscus, lateral, derangement, right    Acute bronchitis 06/27/2021   Hyperkalemia 06/27/2021   SIRS (systemic inflammatory response syndrome) ([place]) 06/26/2021   Thrombocytopenia ([place]) 06/26/2021   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[facility]) atrioventricular block    Elevated troponin 06/24/2021   Acute renal failure superimposed on stage 4 chronic kidney disease ([place]) 06/24/2021   Anemia 06/24/2021   Primary osteoarthritis of right knee 09/06/2018   Hypertensive heart disease without congestive heart failure 09/02/2018   Adjustment disorder 03/18/2018   Chronic kidney disease, stage 4 (severe) ([place]) 09/08/2016   Hypogonadism in male 02/27/2016   S/P rotator cuff repair 09/25/2011   Rotator cuff tear, right 05/06/2011   Shoulder pain 05/06/2011    PCP: Michell Heinrich  REFERRING PROVIDER: Arther Abbott     REFERRING DIAG: KNEE ARTHROSCOPY WITH LATERAL MENISECTOMY  KNEE ARTHROSCOPY WITH MEDIAL MENISECTOMY  THERAPY DIAG:  Stiffness of right knee, not elsewhere classified  Muscle weakness (generalized)  Acute pain of right knee  Difficulty in walking, not elsewhere classified  Rationale for Evaluation and Treatment Rehabilitation  ONSET  DATE: 08/09/2021  SUBJECTIVE:   SUBJECTIVE STATEMENT:   Feeling well today. The pain in his feet are his chief compliant at this time. Getting around better at home.    FROM IE: Pt states that he had arthroscopic surgery on 08/09/2021.  He states that the only thing he has been able to do since the surgery is the CPM, he has done no exercises.   He states that he was walking without a cane or walker prior the surgery but currently he can not even walk to the bathroom.  The pt is constantly groaning, shaking and moaning.   PERTINENT HISTORY: OA, Rt rotator cuff tear   PAIN:  Are you having pain? Yes: NPRS scale: 2/10 Pain location: knee  Pain description: rt  Aggravating factors: standing  Relieving factors: weight bearing  Pain in ankles 4/10.   OCCUPATION: retired   PLOF: Independent  PATIENT GOALS :  less pain, improved mobility, improved strength and to be able to walk without pain.    OBJECTIVE:   DIAGNOSTIC FINDINGS: IMPRESSION: (pre surgery) 1. 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 2. Diffuse complex tearing of the posterior horn of the medial meniscus with near absence of meniscal tissue. 3. Oblique undersurface tear within the anterior horn of the lateral meniscus with additional horizontal tear extending through the anterior wall into a tiny anterior parameniscal cyst. 4. Moderate joint effusion. 5. Small Baker's cyst containing low signal tiny loose bodies. Additional possibly connected fluid seen deep and lateral to the popliteal vessels, also containing small loose bodies. 6. Mild chronic proximal patellar tendinosis with likely small tiny midsubstance partial-thickness tear. 7. Loose body measuring up to 24 mm within the lateral suprapatellar joint space.  PATIENT SURVEYS:  FOTO .5;    2 MWT=323 feet no AD  Stairs Requires cueing for sequence as patient has not done any stairs yet Ascends reciprocal with uni HR at CS Descends step to with uni HR at CS  Transfers at independent with UE support  LOWER EXTREMITY ROM:  Active ROM Right eval Left eval Right 09/09/21  Hip flexion   110  Hip extension     Hip abduction     Hip adduction     Hip internal rotation     Hip external rotation     Knee flexion     Knee extension -7 30 -4   Ankle dorsiflexion     Ankle plantarflexion     Ankle inversion     Ankle eversion      (Blank rows = not tested)  LOWER EXTREMITY MMT:  MMT Right  eval Right 09/09/21 Left 09/09/21  Hip flexion  3+ 5  Hip extension  3+   Hip abduction  4   Hip adduction  3+   Hip internal rotation     Hip external rotation     Knee flexion     Knee extension 3- 4 5  Ankle dorsiflexion  5 5  Ankle plantarflexion     Ankle inversion  5 5  Ankle eversion  5 5   (Blank rows = not tested)   FUNCTIONAL TESTS:  Sit to stand:  Mod to max assist Transfers:  max assist  1 minute walk test: able to stand and walk 4 ft when he states he feels dizzy     TODAY'S TREATMENT: 09/13/21 Bike seat 19 x 5'  Standing: Heel/toe raises 2 x 10 Slant board 5 x 20" Mini squats 2 x  10 Hip vectors 2' hold 2 x 5 right leg 4" step ups 2 x 10    Seated: Hamstring stretch with strap 5 x 20" LAQ's 5# 2 x 10  Supine: Bridge 2 x 10  09/11/21 Standing:  heel raises x 10                  Toe raises x 10                  Functional squat x 10                  Lunge B x 10                   Single leg stance 5 reps B                  Terminal extension 10 reps x 2 at bodycraft using 3 PL                   Slant board stretch 3 x 30"                  Gastroc stretch x 30" x 3 Rt Sitting :  sit to stand x 5 2 sets               Ankle pumps x 15   09/09/21 Discussion on HPI and POC Review of HEP x 5-10 reps each Assessed 2 mwt, stairs, mmt, rom Educated pt on questions regarding gym, building slowly for knee exercises  08/15/2021:  Evaluation ankle pumps.                   PATIENT EDUCATION:  Education details: 09/13/21 added bridge to HEP  Person educated: Patient/wife  Education method: Consulting civil engineer, Verbal cues, and Handouts Education comprehension: verbalized understanding  HOME EXERCISE PROGRAM:  [id number] 7/5:       Added standing:  Heel raises , toe raises, squats                          Sitting sit  to stand              Ankle pumps LAQ Quad set Rt heel slide SLR   ASSESSMENT:  CLINICAL IMPRESSION: Today's session continued to focus on lower extremity mobility and strength. Added LAQs for quad strengthening and hip vectors and bridging for hip strengthening. Added bridge to HEP. No pain in the knee after treatment.  Patient will benefit from skilled PT services to improve impairments and work towards optimal level of function with decreased restriction.   OBJECTIVE IMPAIRMENTS Abnormal gait, decreased activity tolerance, decreased balance, difficulty walking, decreased ROM, decreased strength, increased edema, and pain.   ACTIVITY LIMITATIONS carrying, lifting, sitting, standing, squatting, stairs, and locomotion level  PARTICIPATION LIMITATIONS: cleaning, driving, shopping, community activity, and yard work  [place] are also affecting patient's functional outcome.   REHAB POTENTIAL: Good  CLINICAL DECISION MAKING: Unstable/unpredictable  EVALUATION COMPLEXITY: Low   GOALS: Goals reviewed with patient? Yes  SHORT TERM GOALS: Target date: 09/30/2021  PT to be I in HEP to decrease pain to no greater than a 5/10 Baseline: Current: needs instruction for range improving exercises to get actual benefit from exercise Goal status: IN PROGRESS  2.  PT Rt knee ROM to be at least 2-115 to allow pt to be comfortable sitting for a half hour. Baseline:  Current: 4  to 110 Goal status: REVISED  3.  PT to be able to come sit to stand with UE  with mod I  Baseline:  Current: Independent with UE support Goal status: MET  4.  PT to be able to transfer Independent no UE support Baseline:  Current: Independent with UE support Goal status: REVISED  5.  PT to be able to ambulate with least assistive device for five minutes  Baseline:  Current: 2 minutes Goal status: IN PROGRESS    LONG TERM GOALS: Target date: 10/21/2021   PT to be I in advanced HEP to allow  pain to decrease to no greater than a 2/10 Baseline:  Goal status: IN PROGRESS  2.  Pt Rt LE ROM to be 0-120 to allow pt to be able to squat down to pick an item off the floor.  Baseline:  Current: 4-110 Goal status: IN PROGRESS  3.  Pt strength in Rt LE to be increased to at least 4+/5 to be able to go up and down steps in a reciprocal manner.  Baseline:  Current:  Right 09/09/21  3+  3+  4  3+        '4  5    5  5   ' Goal status: IN PROGRESS  4.  Pt to be able to ambulate for over 30 minutes without difficulty with least assistive device  Baseline:  Current: 2-3 minutes at a time Goal status: IN PROGRESS   PLAN: PT FREQUENCY: 2-3x/week  PT DURATION: 6 weeks  PLANNED INTERVENTIONS: Therapeutic exercises, Balance training, Gait training, Patient/Family education, Cryotherapy, and Manual therapy  PLAN FOR NEXT SESSION: Discuss reduction from 3x week to 2x week.  Update HEP including squats, bridges, prone tband hamstring curls,etc.  Continue weight bearing exercises  , standing knee flexion, and rocker board. Continue to progress ROM   12:01 PM, 09/13/21 Rogen Porte Small Shareese Macha MPT [facility]  [phone]

## 2021-09-16 ENCOUNTER — Encounter (HOSPITAL_COMMUNITY): Payer: Medicare PPO

## 2021-09-17 ENCOUNTER — Encounter (HOSPITAL_COMMUNITY): Payer: Medicare PPO | Admitting: Physical Therapy

## 2021-09-18 ENCOUNTER — Encounter (HOSPITAL_COMMUNITY): Payer: Medicare PPO

## 2021-09-19 ENCOUNTER — Encounter (HOSPITAL_COMMUNITY): Payer: Medicare PPO | Admitting: Physical Therapy

## 2021-09-20 ENCOUNTER — Ambulatory Visit (HOSPITAL_COMMUNITY): Payer: Medicare PPO | Admitting: Physical Therapy

## 2021-09-20 DIAGNOSIS — M25661 Stiffness of right knee, not elsewhere classified: Secondary | ICD-10-CM

## 2021-09-20 DIAGNOSIS — M25561 Pain in right knee: Secondary | ICD-10-CM

## 2021-09-20 DIAGNOSIS — M6281 Muscle weakness (generalized): Secondary | ICD-10-CM

## 2021-09-20 DIAGNOSIS — R262 Difficulty in walking, not elsewhere classified: Secondary | ICD-10-CM

## 2021-09-20 NOTE — Therapy (Signed)
OUTPATIENT PHYSICAL THERAPY LOWER EXTREMITY Treatment   Patient Name: Scott Savage MRN: [id number] DOB:[birth year], 66 y.o., male Today's Date: 09/09/2021 ROM RT knee -2-115 ROM LT knee -2-115  PHYSICAL THERAPY DISCHARGE SUMMARY  Visits from Start of Care: 4  Current functional level related to goals / functional outcomes: All STG and 2/4 LTG met   Remaining deficits: Ankle pain    Education / Equipment: HEP    Patient agrees to discharge. Patient goals were partially met. Patient is being discharged due to being pleased with the current functional level.   PT End of Session - 09/09/21 1720    Visit Number 5   Number of Visits 5   Date for PT Re-Evaluation 09/26/21    Authorization Type [employer] (auth required) 12 visits approved until 09/26/21    Authorization - Visit Number 5   Authorization - Number of Visits 12    Progress Note Due on Visit 12    PT Start Time 1645   PT Stop Time 1710   PT Time Calculation (min) 54mn            Past Medical History:  Diagnosis Date   HTN (hypertension)    Hyperlipemia    PONV (postoperative nausea and vomiting)    Urinary complications    Uses flomax to help empty bladder   Past Surgical History:  Procedure Laterality Date   BIOPSY  08/01/2019   Procedure: BIOPSY;  Surgeon: RRogene Houston MD;  [facility];  Service: Endoscopy;;   COLONOSCOPY N/A 08/01/2019   Procedure: COLONOSCOPY;  Surgeon: RRogene Houston MD;  [facility];  Service: Endoscopy;  Laterality: N/A;  730   KNEE ARTHROSCOPY WITH LATERAL MENISECTOMY Right 08/09/2021   Procedure: KNEE ARTHROSCOPY WITH LATERAL MENISECTOMY;  Surgeon: HCarole Civil MD;  Location: [facility];  Service: Orthopedics;  Laterality: Right;   KNEE ARTHROSCOPY WITH MEDIAL MENISECTOMY Right 08/09/2021   Procedure: KNEE ARTHROSCOPY WITH MEDIAL MENISECTOMY;  Surgeon: HCarole Civil MD;  Location: [facility];  Service: Orthopedics;  Laterality: Right;    left foot  2011   [facility]  left shoulder  2008   [place] Stay in [place] 07/04/2011   Procedure: RESECTION DISTAL CLAVICAL;  Surgeon: SCarole Civil MD;  Location: [facility];  Service: Orthopedics;  Laterality: Right;   SHOULDER OPEN ROTATOR CUFF REPAIR  07/04/2011   Procedure: ROTATOR CUFF REPAIR SHOULDER OPEN;  Surgeon: SCarole Civil MD;  Location: [facility];  Service: Orthopedics;  Laterality: Right;   Patient Active Problem List   Diagnosis Date Noted   Hyponatremia 08/16/2021   Hyperlipidemia 08/16/2021   BPH (benign prostatic hyperplasia) 08/16/2021   Derangement of posterior horn of medial meniscus of right knee    Meniscus, lateral, derangement, right    Acute bronchitis 06/27/2021   Hyperkalemia 06/27/2021   SIRS (systemic inflammatory response syndrome) ([place] 06/26/2021   Thrombocytopenia ([place] 06/26/2021   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[facility] atrioventricular block    Elevated troponin 06/24/2021   Acute renal failure superimposed on stage 4 chronic kidney disease ([place] 06/24/2021   Anemia 06/24/2021   Primary osteoarthritis of right knee 09/06/2018   Hypertensive heart disease without congestive heart failure 09/02/2018   Adjustment disorder 03/18/2018   Chronic kidney disease, stage 4 (severe) ([place] 09/08/2016   Hypogonadism in male 02/27/2016   S/P rotator cuff repair 09/25/2011   Rotator cuff tear, right 05/06/2011   Shoulder pain  05/06/2011    PCP: Michell Heinrich  REFERRING PROVIDER: Arther Abbott     REFERRING DIAG: KNEE ARTHROSCOPY WITH LATERAL MENISECTOMY  KNEE ARTHROSCOPY WITH MEDIAL MENISECTOMY  THERAPY DIAG:  Stiffness of right knee, not elsewhere classified  Muscle weakness (generalized)  Acute pain of right knee  Difficulty in walking, not elsewhere classified  Rationale for Evaluation and Treatment Rehabilitation  ONSET DATE: 08/09/2021  SUBJECTIVE:   SUBJECTIVE STATEMENT:   Pt states that he  is not having any problem with his knee at this time, the pain is in his ankle.  PERTINENT HISTORY: OA, Rt rotator cuff tear   PAIN:  Are you having pain? Yes: NPRS scale: 0/10- knee; ankle 7/10  Pain location: B ankle   Pain description:  switches from Rt to LT  Aggravating factors: standing  Relieving factors: weight bearing     OCCUPATION: retired   PLOF: Independent  PATIENT GOALS :  less pain, improved mobility, improved strength and to be able to walk without pain.    OBJECTIVE:   DIAGNOSTIC FINDINGS: IMPRESSION: (pre surgery) 1. 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 2. Diffuse complex tearing of the posterior horn of the medial meniscus with near absence of meniscal tissue. 3. Oblique undersurface tear within the anterior horn of the lateral meniscus with additional horizontal tear extending through the anterior wall into a tiny anterior parameniscal cyst. 4. Moderate joint effusion. 5. Small Baker's cyst containing low signal tiny loose bodies. Additional possibly connected fluid seen deep and lateral to the popliteal vessels, also containing small loose bodies. 6. Mild chronic proximal patellar tendinosis with likely small tiny midsubstance partial-thickness tear. 7. Loose body measuring up to 24 mm within the lateral suprapatellar joint space.  PATIENT SURVEYS:  FOTO:  7/14 on 74:  was  .5;     2 MWT=323 feet no AD  Stairs Requires cueing for sequence as patient has not done any stairs yet Ascends reciprocal with uni HR at CS Descends step to with uni HR at CS  Transfers at independent with UE support  LOWER EXTREMITY ROM:  Active ROM Right eval Left eval Right 09/09/21 Right  7/14  Hip flexion   110   Hip extension      Hip abduction      Hip adduction      Hip internal rotation      Hip external rotation      Knee flexion    115 equal to left  Knee extension -7 30 -4 -2 equal to left    Ankle dorsiflexion      Ankle plantarflexion      Ankle inversion      Ankle eversion       (Blank rows = not tested)  LOWER EXTREMITY MMT:  MMT Right  eval Right 09/09/21 7/14 Left 09/09/21  Hip flexion  3+ 5 5  Hip extension  3+ 3+ 4  Hip abduction  4 5   Hip adduction  3+ 5   Hip internal rotation      Hip external rotation      Knee flexion   4   Knee extension 3- _0 Ankle dorsiflexion  _1 Ankle plantarflexion   4   Ankle inversion  5  5  Ankle eversion  5  5   (Blank rows = not tested)   FUNCTIONAL TESTS:            7/14:  sit  to stand:  30 seconds = 8 x ; pt is not even in poor category                     Single leg stance:  50"                               EVal:  Sit to stand:  Mod to max assist Transfers:  max assist  1 minute walk test: able to stand and walk 4 ft when he states he feels dizzy     TODAY'S TREATMENT: 7/14:        PT reassessed                 Prone hip extension B x 10                 Supine bridge x 10                 Standing single Rt heel raise x 10                  Sit to stand x 7   09/11/21 Standing:  heel raises x 10                  Toe raises x 10                  Functional squat x 10                  Lunge B x 10                   Single leg stance 5 reps B                  Terminal extension 10 reps x 2 at [employer]craft using 3 PL                   Slant board stretch 3 x 30"                  Gastroc stretch x 30" x 3 Rt Sitting :  sit to stand x 5 2 sets               Ankle pumps x 15   09/09/21 Discussion on HPI and POC Review of HEP x 5-10 reps each Assessed 2 mwt, stairs, mmt, rom Educated pt on questions regarding gym, building slowly for knee exercises  08/15/2021:  Evaluation ankle pumps.                   PATIENT EDUCATION:  Education details: HEP  Person educated: Restaurant manager, fast food: Consulting civil engineer, Verbal cues, and Handouts Education comprehension: verbalized understanding  HOME EXERCISE  PROGRAM:  [id number] 7/14:   Added prone hip extension, single heel raise and bridge  7/5:       Added standing:  Heel raises , toe raises, squats                          Sitting sit to stand              Ankle pumps LAQ Quad set Rt heel slide SLR   ASSESSMENT:  CLINICAL IMPRESSION:  Mr. Divelbiss comes to the department stating that he is not having any knee pain.  He is having B ankle pain  which is affecting his ability to walk and squat. He feels if his ankles were fine he would have no difficulty and desires to be discharged.  Pt  reassessed. Updated HEP and will discharge pt.   OBJECTIVE IMPAIRMENTS Abnormal gait, decreased activity tolerance, decreased balance, difficulty walking, decreased ROM, decreased strength, increased edema, and pain.   ACTIVITY LIMITATIONS carrying, lifting, sitting, standing, squatting, stairs, and locomotion level  PARTICIPATION LIMITATIONS: cleaning, driving, shopping, community activity, and yard work  [place] are also affecting patient's functional outcome.   REHAB POTENTIAL: Good  CLINICAL DECISION MAKING: Unstable/unpredictable  EVALUATION COMPLEXITY: Low   GOALS: Goals reviewed with patient? Yes  SHORT TERM GOALS: Target date: 09/30/2021  PT to be I in HEP to decrease pain to no greater than a 5/10 Baseline: Current: needs instruction for range improving exercises to get actual benefit from exercise Goal status: met  2.  PT Rt knee ROM to be at least 2-115 to allow pt to be comfortable sitting for a half hour. Baseline:  Current: 4 to 110 Goal status: met  3.  PT to be able to come sit to stand with UE  with mod I  Baseline:  Current: Independent with UE support Goal status: MET  4.  PT to be able to transfer Independent no UE support Baseline:  Current: Independent with UE support Goal status: met   5.  PT to be able to ambulate with least assistive device for five minutes  Baseline:  Current: 2 minutes Goal  status: met     LONG TERM GOALS: Target date: 10/21/2021   PT to be I in advanced HEP to allow pain to decrease to no greater than a 2/10 Baseline:  Goal status: met  2.  Pt Rt LE ROM to be 0-120 to allow pt to be able to squat down to pick an item off the floor.  Baseline:  Current: 2-115 Goal status: IN PROGRESS but is now equal to left  3.  Pt strength in Rt LE to be increased to at least 4+/5 to be able to go up and down steps in a reciprocal manner.  Baseline: see above              Goal status: partially met   4.  Pt to be able to ambulate for over 30 minutes without difficulty with least assistive device  Baseline:  Current: 2-3 minutes at a time Goal status: IN PROGRESS; unable due to ankle not due to knee    PLAN: PT FREQUENCY: 2-3x/week  PT DURATION: 6 weeks  PLANNED INTERVENTIONS: Therapeutic exercises, Balance training, Gait training, Patient/Family education, Cryotherapy, and Manual therapy  PLAN FOR NEXT SESSION: Discharge.   Rayetta Humphrey, [place] CLT [phone]  1721 PM

## 2021-09-23 ENCOUNTER — Ambulatory Visit (INDEPENDENT_AMBULATORY_CARE_PROVIDER_SITE_OTHER): Payer: Medicare PPO | Admitting: Orthopedic Surgery

## 2021-09-23 DIAGNOSIS — M659 Synovitis and tenosynovitis, unspecified: Secondary | ICD-10-CM

## 2021-09-23 DIAGNOSIS — Z9889 Other specified postprocedural states: Secondary | ICD-10-CM

## 2021-09-23 DIAGNOSIS — M23321 Other meniscus derangements, posterior horn of medial meniscus, right knee: Secondary | ICD-10-CM

## 2021-09-23 MED ORDER — PREDNISONE 10 MG PO TABS: 10.0000 mg | ORAL_TABLET | Freq: Three times a day (TID) | ORAL | 0 refills | Status: AC

## 2021-09-23 NOTE — Progress Notes (Signed)
  FOLLOW UP   Encounter Diagnosis  Name Primary?   Synovitis of right ankle Yes     Chief Complaint  Patient presents with   Routine Post Op    S/p RT knee scope DOS 08/09/21    Scott Savage has improved in terms of his knee he is regained his range of motion just slight swelling  His main complaint today is that his right ankle is still hurting right in the joint.  The tibialis anterior tendon is nontender.  He has pain with dorsiflexion and the ankle joint Achilles is normal  Posterior tibial tendon normal  I suspect he has some synovitis  Recommend Dosepak  Meds ordered this encounter  Medications   predniSONE (DELTASONE) 10 MG tablet    Sig: Take 1 tablet (10 mg total) by mouth 3 (three) times daily.    Dispense:  42 tablet    Refill:  0

## 2021-09-23 NOTE — Patient Instructions (Signed)
Start   Meds ordered this encounter  Medications   predniSONE (DELTASONE) 10 MG tablet    Sig: Take 1 tablet (10 mg total) by mouth 3 (three) times daily.    Dispense:  42 tablet    Refill:  0

## 2021-10-21 ENCOUNTER — Ambulatory Visit (INDEPENDENT_AMBULATORY_CARE_PROVIDER_SITE_OTHER): Payer: Medicare PPO

## 2021-10-21 ENCOUNTER — Encounter: Payer: Self-pay | Admitting: Orthopedic Surgery

## 2021-10-21 ENCOUNTER — Ambulatory Visit (INDEPENDENT_AMBULATORY_CARE_PROVIDER_SITE_OTHER): Payer: Medicare PPO | Admitting: Orthopedic Surgery

## 2021-10-21 DIAGNOSIS — Z9889 Other specified postprocedural states: Secondary | ICD-10-CM

## 2021-10-21 DIAGNOSIS — M25571 Pain in right ankle and joints of right foot: Secondary | ICD-10-CM

## 2021-10-21 NOTE — Progress Notes (Signed)
Chief Complaint  Patient presents with   Post-op Follow-up    Right knee scope 08/09/21   C/o pain right foot and ankle and mild discomfort right knee  Scott Savage is recovering from his right knee arthroscopy which was complicated by acute renal failure which was acute on chronic renal failure  He started having some difficulty with his foot complaining of pain in the anterolateral ankle and subtalar region.  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

## 2021-10-21 NOTE — Patient Instructions (Signed)
Call [facility] to make appt to get brace for the foot   Wear knee sleeve   Return in 2 months

## 2021-12-09 DIAGNOSIS — I2699 Other pulmonary embolism without acute cor pulmonale: Secondary | ICD-10-CM | POA: Insufficient documentation

## 2021-12-20 ENCOUNTER — Ambulatory Visit (INDEPENDENT_AMBULATORY_CARE_PROVIDER_SITE_OTHER): Payer: Medicare PPO | Admitting: Orthopedic Surgery

## 2021-12-20 ENCOUNTER — Encounter: Payer: Self-pay | Admitting: Orthopedic Surgery

## 2021-12-20 DIAGNOSIS — Z9889 Other specified postprocedural states: Secondary | ICD-10-CM

## 2021-12-20 DIAGNOSIS — M659 Synovitis and tenosynovitis, unspecified: Secondary | ICD-10-CM

## 2021-12-20 NOTE — Progress Notes (Signed)
Chief Complaint  Patient presents with   Knee Pain    RT knee// improving slightly but still sore   Ankle Pain    RT ankle/improving/ feels a lot better with brace on    Encounter Diagnoses  Name Primary?   S/P right knee arthroscopy 08/09/21 Yes   Synovitis of right ankle     Past Surgical History:  Procedure Laterality Date   BIOPSY  08/01/2019   Procedure: BIOPSY;  Surgeon: Rogene Houston, MD;  Location: [facility];  Service: Endoscopy;;   COLONOSCOPY N/A 08/01/2019   Procedure: COLONOSCOPY;  Surgeon: Rogene Houston, MD;  Location: [facility];  Service: Endoscopy;  Laterality: N/A;  730   KNEE ARTHROSCOPY WITH LATERAL MENISECTOMY Right 08/09/2021   Procedure: KNEE ARTHROSCOPY WITH LATERAL MENISECTOMY;  Surgeon: Carole Civil, MD;  Location: [facility];  Service: Orthopedics;  Laterality: Right;   KNEE ARTHROSCOPY WITH MEDIAL MENISECTOMY Right 08/09/2021   Procedure: KNEE ARTHROSCOPY WITH MEDIAL MENISECTOMY;  Surgeon: Carole Civil, MD;  Location: [facility];  Service: Orthopedics;  Laterality: Right;   left foot  2011   [facility]   left shoulder  2008   Short Stay in [place]  07/04/2011   Procedure: RESECTION DISTAL CLAVICAL;  Surgeon: Carole Civil, MD;  Location: [facility];  Service: Orthopedics;  Laterality: Right;   SHOULDER OPEN ROTATOR CUFF REPAIR  07/04/2011   Procedure: ROTATOR CUFF REPAIR SHOULDER OPEN;  Surgeon: Carole Civil, MD;  Location: [facility];  Service: Orthopedics;  Laterality: Right;   Exam:   His right ankle brace he got last week he likes that it is helping him with his walking he says he can feel the difference  His right knee has good range of motion he has good extension no extensor lag he has no tenderness no warmth  He says he is weak when he walks and when he does his exercises the next day his knee is very sore  I think he has 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

## 2022-03-26 ENCOUNTER — Ambulatory Visit: Payer: Medicare PPO | Admitting: Orthopedic Surgery

## 2022-03-28 ENCOUNTER — Ambulatory Visit: Payer: Medicare PPO | Admitting: Orthopedic Surgery

## 2022-05-08 ENCOUNTER — Encounter: Payer: Self-pay | Admitting: Radiology

## 2022-11-28 IMAGING — CT CT CHEST W/O CM
2 of 4 series · 15 of 36 positions shown, 18 images · non-contrast
Comparison: Chest radiograph 06/24/2021. No prior CT.

CLINICAL DATA: Increased cough and weakness for 2 weeks.
Nondiagnostic chest radiograph.



[Series 2: routine chest without · axial · non-contrast · 0.89mm/px · z∈[+1214,+1508]mm · 12 of 175 slices shown, 15 images]
[im 14/175  mediastinal]
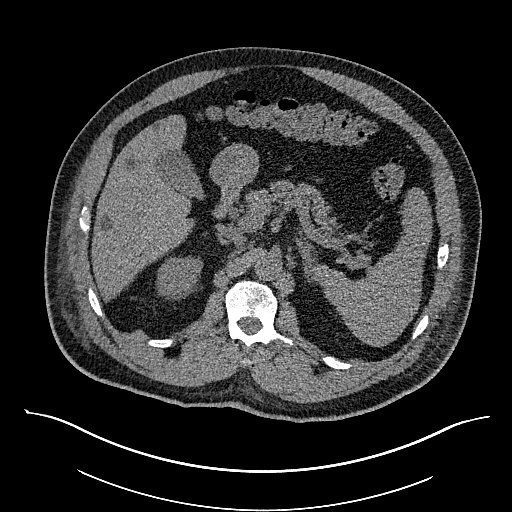
[im 14/175  lung]
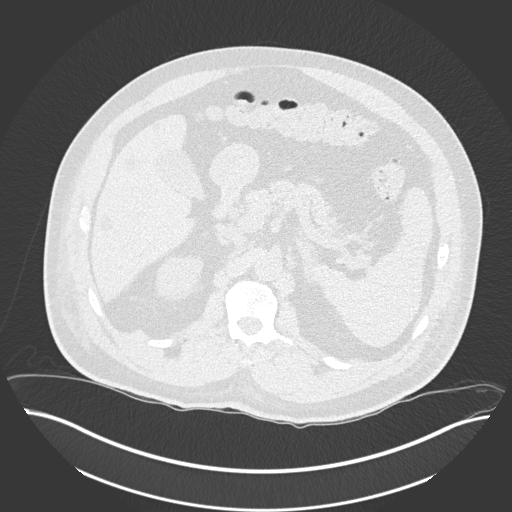
[im 27/175  lung]
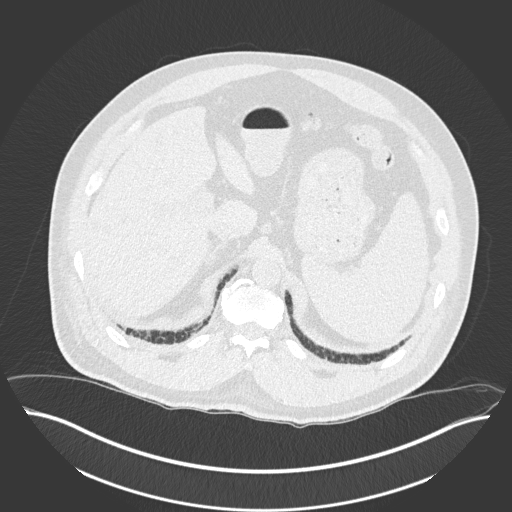
[im 41/175  lung]
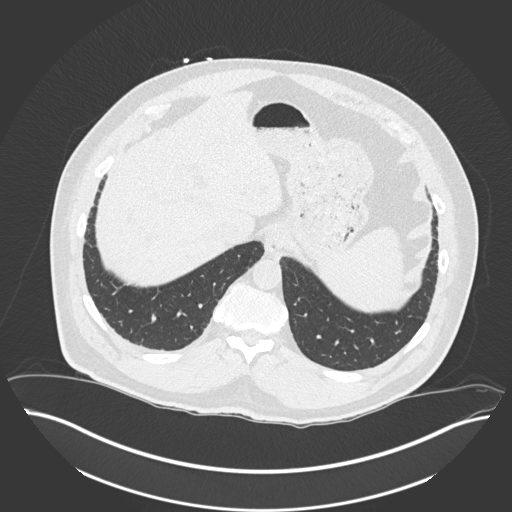
[im 54/175  lung]
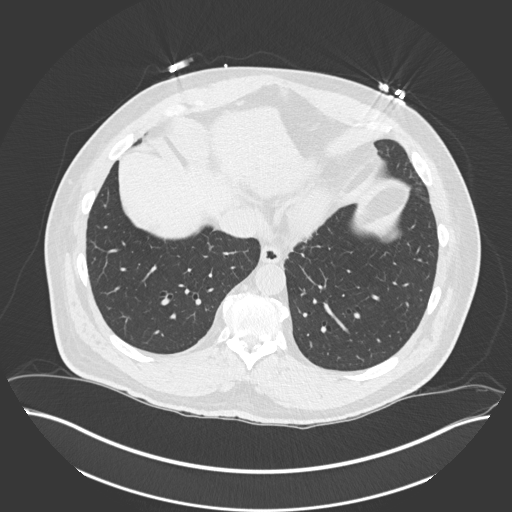
[im 67/175  mediastinal]
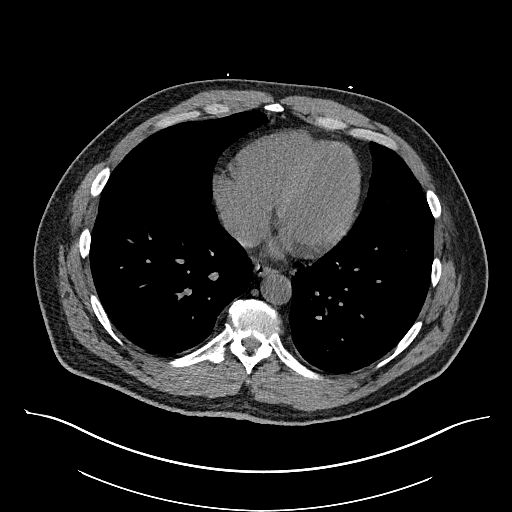
[im 67/175  lung]
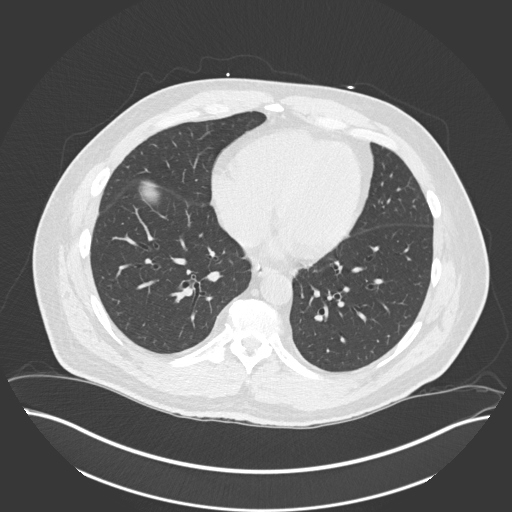
[im 81/175  lung]
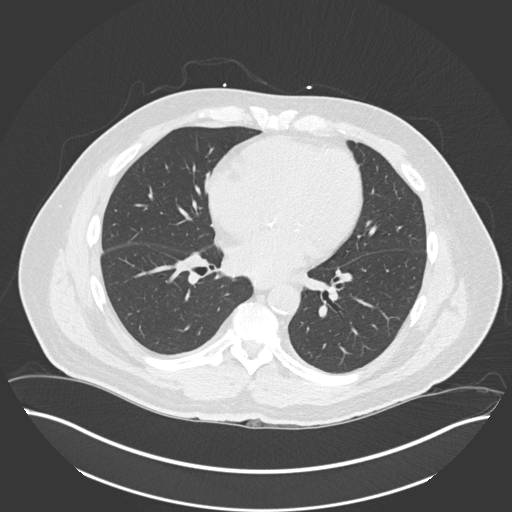
[im 94/175  lung]
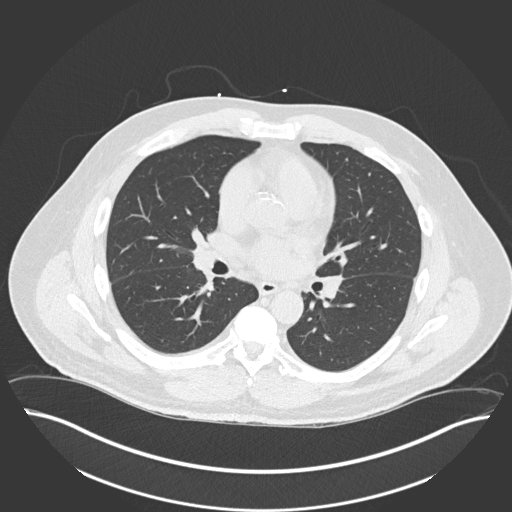
[im 108/175  lung]
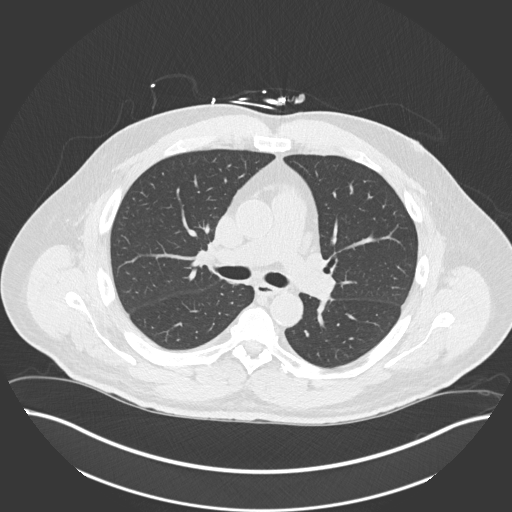
[im 121/175  mediastinal]
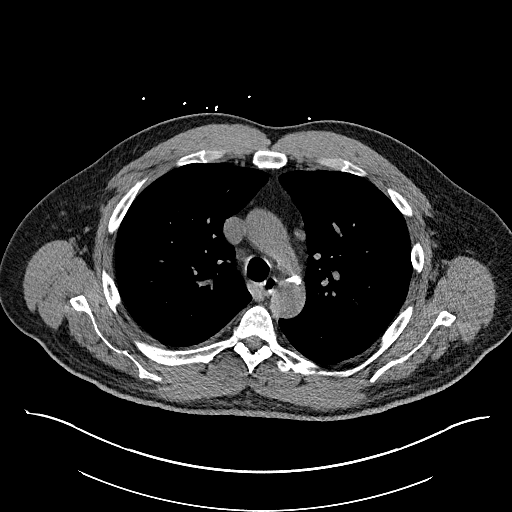
[im 121/175  lung]
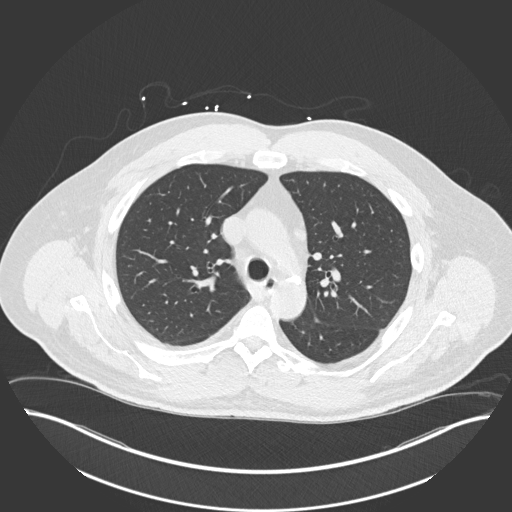
[im 134/175  lung]
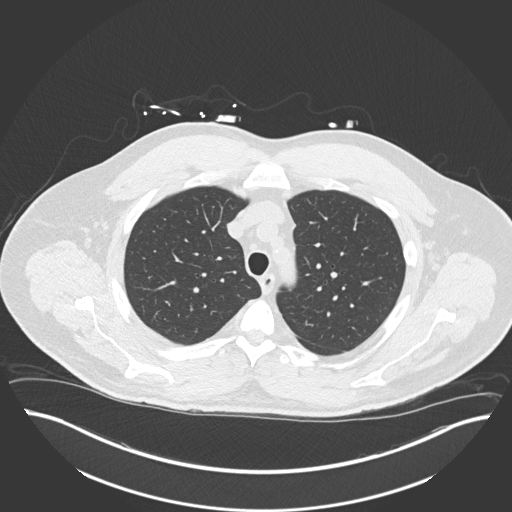
[im 148/175  lung]
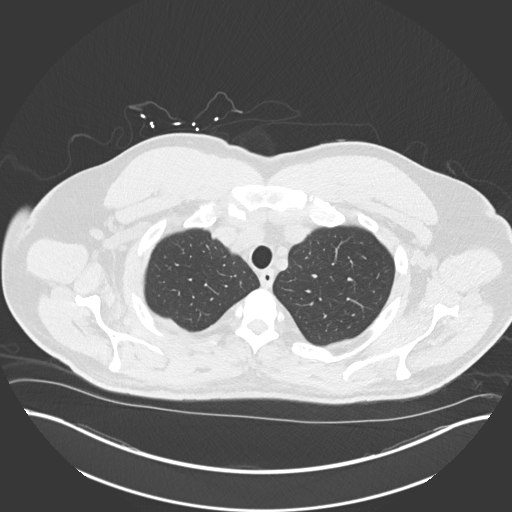
[im 161/175  lung]
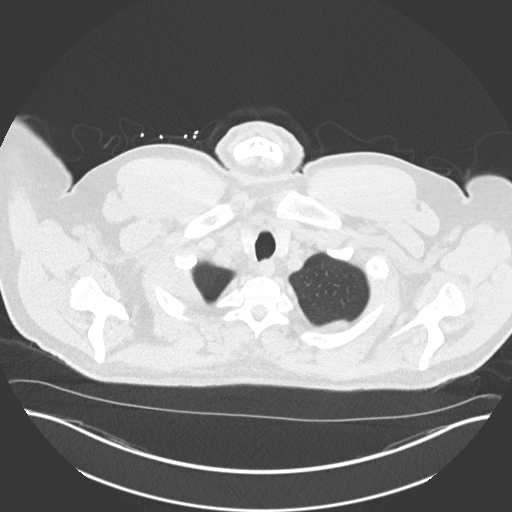

[Series 5: coronal · coronal · 0.70mm/px · 3 of 178 slices shown]
[im 36/178  lung]
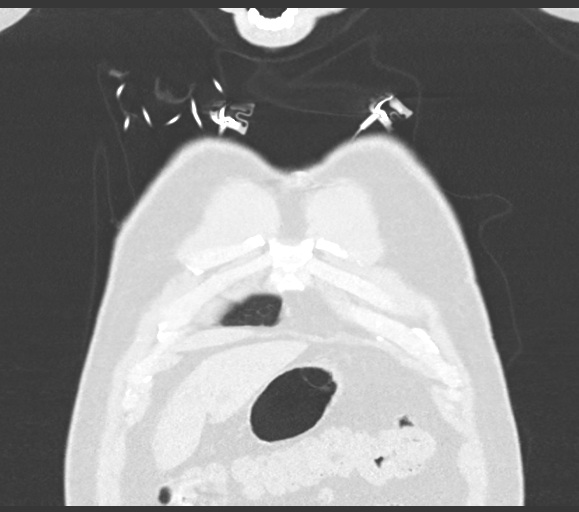
[im 71/178  lung]
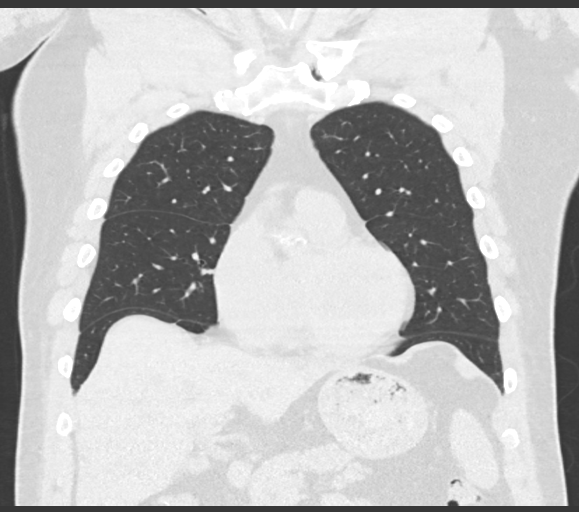
[im 107/178  lung]
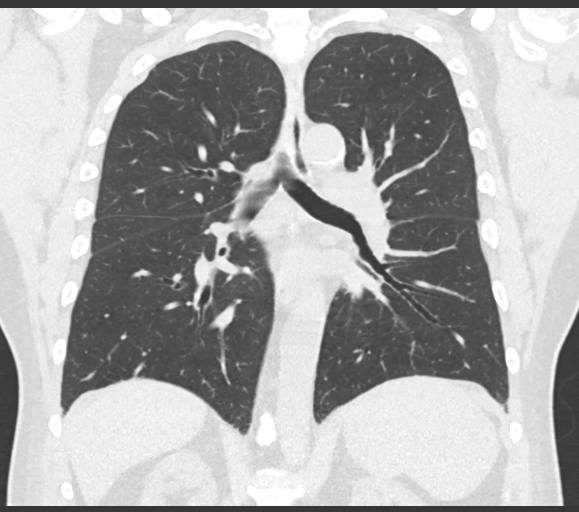

[15 of 36 positions shown; findings below may reference images not displayed]

FINDINGS: Cardiovascular: Aortic atherosclerosis.  Borderline cardiomegaly.

Mediastinum/Nodes: No mediastinal or definite hilar adenopathy,
given limitations of unenhanced CT.

Lungs/Pleura: No pleural fluid.  Clear lungs.

Upper Abdomen: Multiple tiny low-density well-circumscribed liver
lesions are likely cysts and bile duct hamartomas. Normal imaged
portions of the spleen, stomach, pancreas, adrenal glands. Mild
bilateral renal cortical thinning. Suspect dependent gallstone or
stones on 175/2. Abdominal aortic atherosclerosis.

Musculoskeletal: No acute osseous abnormality.
IMPRESSION: 1.  No acute process or explanation for cough.
2.  Aortic Atherosclerosis ([id number]-W21.1).
3. Possible cholelithiasis.

## 2023-01-18 IMAGING — DX DG KNEE 1-2V PORT*L*
2 series · 2 of 2 positions shown · non-contrast
Comparison: None Available.

CLINICAL DATA: Left knee pain.

EXAM:
PORTABLE LEFT KNEE - 1-2 VIEW

[knee ap]
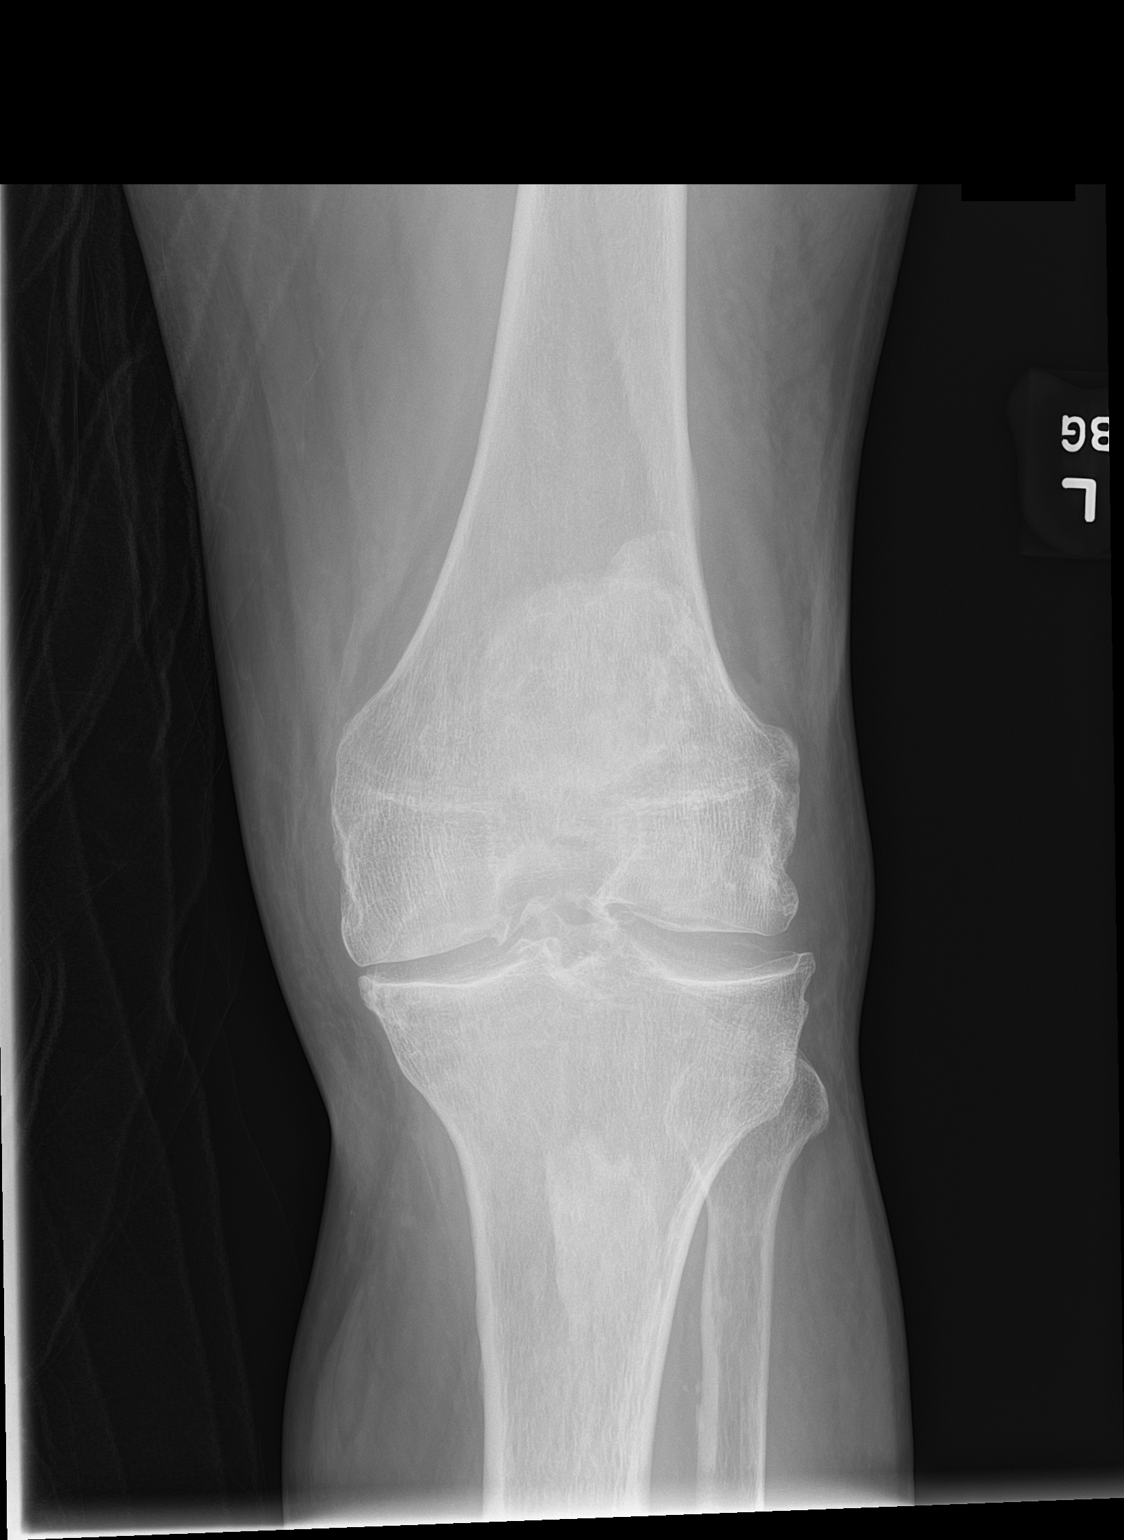

[knee lat]
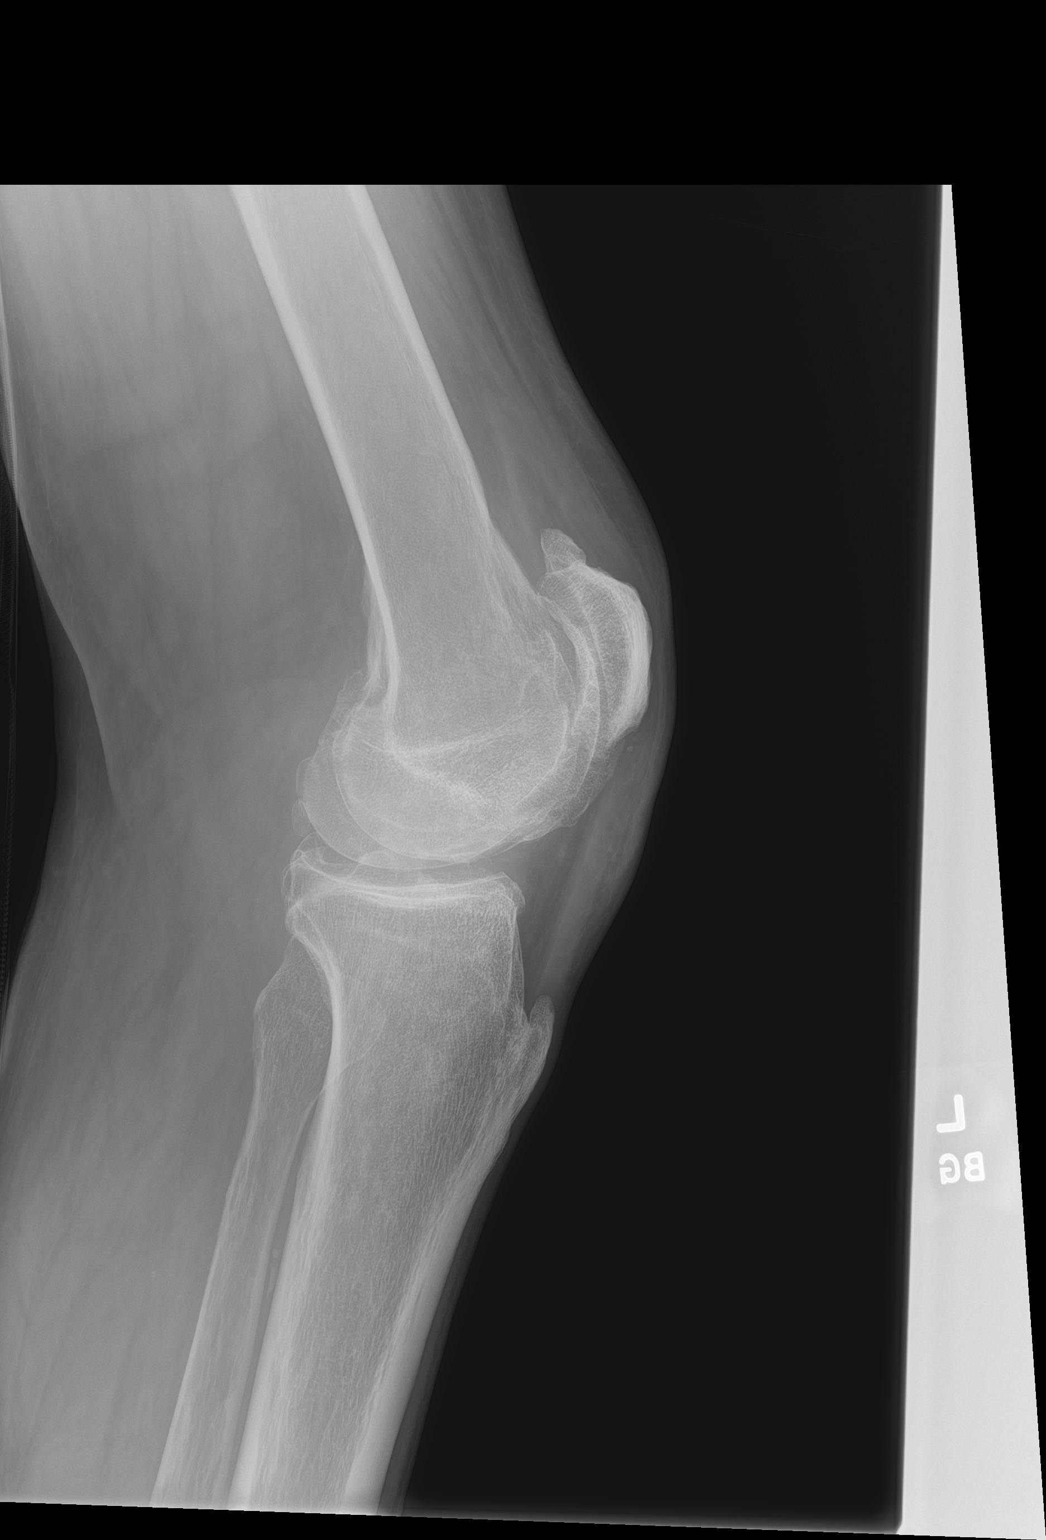

[2 of 2 positions shown; findings below may reference images not displayed]

FINDINGS: No evidence of fracture, dislocation, or joint effusion. There is
moderate patellofemoral and mild medial compartment joint space
narrowing. There is tricompartmental osteophyte formation and
spurring of the tibial spines. Soft tissues are within normal
limits.
IMPRESSION: 1. No acute bony abnormality.
2. Moderate degenerative changes of the knee.

## 2023-07-10 ENCOUNTER — Encounter: Payer: Self-pay | Admitting: Radiology

## 2023-08-17 ENCOUNTER — Encounter: Payer: Self-pay | Admitting: Podiatry

## 2023-08-17 ENCOUNTER — Ambulatory Visit: Admitting: Podiatry

## 2023-08-17 ENCOUNTER — Ambulatory Visit (INDEPENDENT_AMBULATORY_CARE_PROVIDER_SITE_OTHER)

## 2023-08-17 DIAGNOSIS — M25572 Pain in left ankle and joints of left foot: Secondary | ICD-10-CM | POA: Diagnosis not present

## 2023-08-17 DIAGNOSIS — M722 Plantar fascial fibromatosis: Secondary | ICD-10-CM | POA: Diagnosis not present

## 2023-08-17 DIAGNOSIS — M19072 Primary osteoarthritis, left ankle and foot: Secondary | ICD-10-CM | POA: Diagnosis not present

## 2023-08-17 MED ORDER — TRIAMCINOLONE ACETONIDE 10 MG/ML IJ SUSP: 10.0000 mg | Freq: Once | INTRAMUSCULAR | Status: AC

## 2023-08-17 MED ORDER — TRIAMCINOLONE ACETONIDE 10 MG/ML IJ SUSP
10.0000 mg | Freq: Once | INTRAMUSCULAR | Status: AC
  Administered 2023-08-17: 10 mg

## 2023-08-17 NOTE — Progress Notes (Signed)
 Patient with presents with complaint of the left foot.  Is having pain in the midfoot with weightbearing and walking.  Has tried icing it with no improvement.  About 30 years ago he had a triple arthrodesis done on the left foot.   Physical exam:  General appearance: Pleasant, and in no acute distress. AOx3.  Vascular: Pedal pulses: DP 2/4 bilaterally, PT 2/4 bilaterally.  Mild edema lower legs bilaterally. Capillary fill time immediate.  Neurological: Light touch intact feet bilaterally.  Normal Achilles reflex bilaterally.  No clonus or spasticity noted.  Negative Tinel's sign tarsal tunnel, porta pedis, and peripheral nerves around the ankle.  Dermatologic:   Skin normal temperature bilaterally.  Skin normal color, tone, and texture bilaterally.   Musculoskeletal: Tenderness in the dorsal aspect of the foot at the second cuneiform navicular joint and the second tarsal metatarsal joint of the left.  Pain with pain with range of motion of the second ray.  Slight or no tenderness at sinus tarsi or ankle.  Radiographs: 3 views left foot: Uneven joint space narrowing at 2nd and 3rd tarsal metatarsal phalangeal joints and cuneiform navicular joints.  This is especially severe at the second left.  Hardware in place from triple arthrodesis with good alignment noted.  Noticed any fractures or dislocations  Diagnosis: 1.  Pain left foot. 2.  Arthralgia second cuneiform navicular joint left 3.  osteoarthritis midfoot joints foot left  Plan: -New patient office visit level 3 for evaluation and management -Discussed with him arthritis in the joint and midfoot and the resulting pain.  Hardware is in place at the triple arthrodesis site and is noncontributory to symptoms.  Explained to him that is unusual to develop arthritis in these joints after triple arthrodesis especially given the time from when he had it.  Recommended RICE -Discussed proper shoes to wear. -Injected 3cc 2:1 mixture 0.5 cc  Marcaine :Kenolog 10mg /28ml at second naviculocuneiform joint left.    Return 1 weeks follow-up injection left foot

## 2023-08-25 ENCOUNTER — Ambulatory Visit: Admitting: Podiatry

## 2023-08-25 ENCOUNTER — Encounter: Payer: Self-pay | Admitting: Podiatry

## 2023-08-25 DIAGNOSIS — M25572 Pain in left ankle and joints of left foot: Secondary | ICD-10-CM

## 2023-08-25 MED ORDER — TRIAMCINOLONE ACETONIDE 10 MG/ML IJ SUSP
10.0000 mg | Freq: Once | INTRAMUSCULAR | Status: AC
  Administered 2023-08-25: 10 mg

## 2023-08-25 NOTE — Progress Notes (Signed)
 Presents today follow-up injection naviculocuneiform joints.  Doing much better.  Today indicates pain mostly at the second tarsal metatarsal joint left  Physical exam:  General appearance: Pleasant, and in no acute distress. AOx3.  Vascular: Pedal pulses: DP 2/4 bilaterally, PT 2/4 bilaterally. Mild edema lower legs bilaterally. Capillary fill time immediate..  Neurological: Light touch intact feet bilaterally.  Normal Achilles reflex bilaterally.  Dermatologic:   Skin normal temperature bilaterally.  Skin normal color, tone, and texture bilaterally.   Musculoskeletal: Tenderness at the second tarsal metatarsal joint left foot.  No tenderness today at the cuneiform navicular joints.  Tenderness with range of motion of the second tarsal metatarsal joint left  Diagnosis: 1.  Arthralgia tarsal metatarsal joint left foot  Plan: -No pain today at the navicular cuneiform joint on the left.  Will inject the TMT 2 today. -injected 3cc 2:1 mixture 0.5 cc Marcaine :Kenolog 10mg /66ml at second tarsometatarsal joint left.     Return 2 weeks follow-up injection

## 2023-09-09 ENCOUNTER — Encounter: Payer: Self-pay | Admitting: Podiatry

## 2023-09-09 ENCOUNTER — Ambulatory Visit: Admitting: Podiatry

## 2023-09-09 DIAGNOSIS — M25571 Pain in right ankle and joints of right foot: Secondary | ICD-10-CM | POA: Diagnosis not present

## 2023-09-09 NOTE — Progress Notes (Signed)
 Presents today follow-up injection TMT to right foot.  Doing much better very little discomfort in the foot right now.  Just a little bit of aching at times when he is on it walking.   Physical exam:  General appearance: Pleasant, and in no acute distress. AOx3.  Vascular: Pedal pulses: DP 2/4 bilaterally, PT 2/4 bilaterally.  Mild edema lower legs bilaterally. Capillary fill time immediate.  Neurological: Grossly intact bilaterally  Dermatologic:   Skin normal temperature bilaterally.  Skin normal color, tone, and texture bilaterally.   Musculoskeletal: Slight soreness with palpation over the second TMT and naviculocuneiform joint.  No tenderness with stressing on the joints.   Diagnosis: 1.  Arthralgia midtarsal joints right foot.-Improved  Plan: -Established office visit for evaluation and management level 3. - Discussed wearing good supportive shoes like the new balance he is wearing today.  Recommended icing 20 minutes an hour as needed.  Voltaren gel as needed.  Also OTC NSAIDs as needed.   Return as needed

## 2023-11-06 ENCOUNTER — Ambulatory Visit: Admitting: Orthopedic Surgery

## 2023-11-06 ENCOUNTER — Encounter: Payer: Self-pay | Admitting: Orthopedic Surgery

## 2023-11-06 ENCOUNTER — Other Ambulatory Visit (INDEPENDENT_AMBULATORY_CARE_PROVIDER_SITE_OTHER)

## 2023-11-06 VITALS — BP 172/92 | HR 69

## 2023-11-06 DIAGNOSIS — M79642 Pain in left hand: Secondary | ICD-10-CM | POA: Diagnosis not present

## 2023-11-06 DIAGNOSIS — M25522 Pain in left elbow: Secondary | ICD-10-CM | POA: Diagnosis not present

## 2023-11-06 DIAGNOSIS — M7022 Olecranon bursitis, left elbow: Secondary | ICD-10-CM | POA: Diagnosis not present

## 2023-11-06 DIAGNOSIS — M19042 Primary osteoarthritis, left hand: Secondary | ICD-10-CM | POA: Diagnosis not present

## 2023-11-06 NOTE — Progress Notes (Signed)
 Patient: Scott Savage           Date of Birth: May 21, 1955           MRN: [id number] Visit Date: 11/06/2023 Requested by: Joella Sieving, DO [address] [place],  [place] [id number] PCP: Joella Sieving, DO   Chief Complaint  Patient presents with   Elbow Pain   Encounter Diagnoses  Name Primary?   Pain in left hand Yes   Pain in left elbow    Olecranon bursitis, left elbow    Inflammation of joint of finger of left hand     Plan:  Observation left elbow  Passive and active range of motion left index finger with application of topical medication  Follow-up 3 to 4 weeks  Chief Complaint  Patient presents with   Elbow Pain    HPI 68 year old male with chronic renal insufficiency presents with left elbow pain and left index finger stiffness.  Patient had aspiration left elbow with clear synovial fluid drained in [place] at urgent care and the swelling and tenderness over the elbow have somewhat subsided  He also has inability to flex his left index finger.  He says it was really swollen at the same time his elbow was swollen and that has subsequently decreased in size but the range of motion has not improved  There is no height or weight on file to calculate BMI.   Problem list, medical hx, medications and allergies reviewed   ROS no chest pain or shortness of breath present   Allergies  Allergen Reactions   Hydrocodone  Itching and Nausea And Vomiting   Other Nausea And Vomiting    Anesthesia-significant nausea/vomiting    BP (!) 172/92   Pulse 69    Physical exam: Physical Exam Awake alert [place] x 3  Mood affect normal  General appearance normal Ortho Exam  MSK:   Left elbow skin is normal thickening of the bursa no fluid full range of motion of the left elbow  Swelling of the left index finger compared to the right with skin changes consistent with prior swelling.  PIP and MCP joint still swollen passive flexion is normal but active flexion is not any  has tenderness over the A1 pulley  Data reviewed:   Image(s) reviewed with personal interpretation:   DG Hand Complete Left Result Date: 11/06/2023 Left hand images to evaluate swollen left index finger Soft tissue swelling along the proximal phalanx of the left index finger MCP joint PIP joint look normal.  There is some bony changes along the volar aspect of the proximal phalanx but these are not acute changes Impression normal left index finger except for soft tissue swelling   DG ELBOW COMPLETE LEFT (3+VIEW) Result Date: 11/06/2023 Swollen left elbow olecranon bursitis Left elbow x-ray series negative for fracture dislocation or arthritis Small calcifications are seen in the triceps tendon Normal left elbow     Assessment and plan:  Encounter Diagnoses  Name Primary?   Pain in left hand Yes   Pain in left elbow    Olecranon bursitis, left elbow    Inflammation of joint of finger of left hand     68 year old male with chronic renal insufficiency acute swelling of the left index finger and left elbow rule out  1.  Deposition disease  2.  Bursitis elbow  3.  Tenosynovitis left index finger  Plan elbow nothing to do at this point.  Patient advised to return for aspiration as needed  Tenosynovitis left index  finger  Recommend ice active range of motion topical heat creams etc.   No orders of the defined types were placed in this encounter.   Procedures:   None

## 2023-11-06 NOTE — Progress Notes (Signed)
  Intake history:  BP (!) 172/92   Pulse 69  There is no height or weight on file to calculate BMI.    WHAT ARE WE SEEING YOU FOR TODAY?   left elbow had elbow drained 1 wk ago  How long has this bothered you? (DOI?DOS?WS?)  approximately 1 month(s) ago  Was there an injury? Yes  Anticoag.  Yes  Diabetes No  Heart disease Yes  Hypertension Yes  SMOKING HX No  Kidney disease Yes  Any ALLERGIES ______________________________________________   Treatment:  Have you taken:  Tylenol  No  Advil No  Had PT No  Had injection Yes  Other  _________________________

## 2023-11-06 NOTE — Patient Instructions (Addendum)
 Passively move the left index finger with your other hand to maintain range of motion  Use ice as needed to control swelling of the index finger  Apply aspercreme to fine=ger 2 x a day   As far as the elbow goes if the swelling comes back call for repeat aspiration

## 2023-11-27 ENCOUNTER — Ambulatory Visit: Admitting: Orthopedic Surgery

## 2024-01-19 ENCOUNTER — Ambulatory Visit: Admitting: Podiatry

## 2024-01-19 ENCOUNTER — Encounter: Payer: Self-pay | Admitting: Podiatry

## 2024-01-19 DIAGNOSIS — M25571 Pain in right ankle and joints of right foot: Secondary | ICD-10-CM

## 2024-01-19 DIAGNOSIS — M25572 Pain in left ankle and joints of left foot: Secondary | ICD-10-CM | POA: Diagnosis not present

## 2024-01-19 MED ORDER — TRIAMCINOLONE ACETONIDE 40 MG/ML IJ SUSP
40.0000 mg | Freq: Once | INTRAMUSCULAR | Status: AC
  Administered 2024-01-19: 40 mg

## 2024-01-19 NOTE — Progress Notes (Signed)
 Presents complaining of pain in the dorsal aspect of the foot.  Indicates over the area of the mid tarsus.  Gets painful with walking and standing.  Has not noticed any redness.  Says it does get swollen over the area.  We last injected back in June of this year   Physical exam:  General appearance: Pleasant, and in no acute distress. AOx3.  Vascular: Pedal pulses: DP 2/4 bilaterally, PT 1/4 bilaterally.  Moderate edema lower legs bilaterally. Capillary fill time immediate bilaterally.  Neurological: [place] intact bilaterally  Dermatologic:   Skin normal temperature bilaterally.  Skin normal color, tone, and texture bilaterally.   Musculoskeletal: Tenderness at the navicular cuneiform joint bilaterally and is and also at the second TMT bilaterally.  Some edema over this area.  Osteophytic changes palpable.  Diagnosis: 1.  Arthralgia naviculocuneiform joints bilaterally  Plan: -Discussed patient the pain in the joints in the midfoot.  Will try injections bilaterally.  Symptoms helps relieve some of the pain.  Wear good stable shoes. -injected 3cc 2:1 mixture 0.5 cc Marcaine :Kenolog 10mg /30ml at navicular cuneiform joints bilaterally.     Return weeks follow-up injection joints foot bilaterally

## 2024-02-02 ENCOUNTER — Ambulatory Visit: Admitting: Podiatry

## 2024-02-02 DIAGNOSIS — M25571 Pain in right ankle and joints of right foot: Secondary | ICD-10-CM

## 2024-02-02 DIAGNOSIS — M150 Primary generalized (osteo)arthritis: Secondary | ICD-10-CM

## 2024-02-02 DIAGNOSIS — M25572 Pain in left ankle and joints of left foot: Secondary | ICD-10-CM | POA: Diagnosis not present

## 2024-02-02 MED ORDER — TRIAMCINOLONE ACETONIDE 40 MG/ML IJ SUSP
40.0000 mg | Freq: Once | INTRAMUSCULAR | Status: AC
  Administered 2024-02-02: 40 mg

## 2024-02-02 NOTE — Progress Notes (Signed)
 Patient presents follow-up injection to naviculocuneiform joints bilaterally.  Doing much better by about 80 to 85% better still having pain.  Would like a second injection in each foot.   Physical exam:  General appearance: Pleasant, and in no acute distress. AOx3.  Vascular: Pedal pulses: DP 2/4 bilaterally, PT 1/4 bilaterally.  Moderate edema lower legs bilaterally. Capillary fill time immediate bilaterally.  Neurological: Grossly intact bilaterally  Dermatologic:   Skin normal temperature bilaterally.  Skin normal color, tone, and texture bilaterally.   Musculoskeletal: There is over the navicular cuneiform joints especially over the middle and medial joints.  Bilaterally  Diagnosis: 1.  Arthralgia navicular cuneiform joints bilaterally  Plan: -Will try second set of injections at the joints. -injected 3cc 2:1 mixture 0.5 cc Marcaine : Triamcinolone  40mg /44ml at navicular cuneiform joints bilaterally. '   Return 2 weeks follow-up injections bilaterally

## 2024-02-18 ENCOUNTER — Ambulatory Visit: Admitting: Podiatry
